# Patient Record
Sex: Female | Born: 1945
Health system: Southern US, Community
[De-identification: ages and names within clinical notes are randomized; demographics above are authoritative.]

---

## 1997-08-24 ENCOUNTER — Ambulatory Visit (HOSPITAL_COMMUNITY): Admission: RE | Admit: 1997-08-24 | Discharge: 1997-08-24 | Payer: Self-pay | Admitting: Family Medicine

## 1997-09-23 ENCOUNTER — Ambulatory Visit (HOSPITAL_COMMUNITY): Admission: RE | Admit: 1997-09-23 | Discharge: 1997-09-23 | Payer: Self-pay | Admitting: Gastroenterology

## 1997-10-05 ENCOUNTER — Ambulatory Visit (HOSPITAL_COMMUNITY): Admission: RE | Admit: 1997-10-05 | Discharge: 1997-10-05 | Payer: Self-pay | Admitting: Family Medicine

## 1997-10-05 ENCOUNTER — Encounter: Payer: Self-pay | Admitting: Family Medicine

## 2000-07-10 ENCOUNTER — Encounter: Payer: Self-pay | Admitting: Family Medicine

## 2000-07-10 ENCOUNTER — Ambulatory Visit (HOSPITAL_COMMUNITY): Admission: RE | Admit: 2000-07-10 | Discharge: 2000-07-10 | Payer: Self-pay | Admitting: Family Medicine

## 2000-11-19 ENCOUNTER — Other Ambulatory Visit: Admission: RE | Admit: 2000-11-19 | Discharge: 2000-11-19 | Payer: Self-pay | Admitting: Family Medicine

## 2001-07-02 ENCOUNTER — Ambulatory Visit (HOSPITAL_COMMUNITY): Admission: RE | Admit: 2001-07-02 | Discharge: 2001-07-02 | Payer: Self-pay | Admitting: Gastroenterology

## 2001-07-02 ENCOUNTER — Encounter (INDEPENDENT_AMBULATORY_CARE_PROVIDER_SITE_OTHER): Payer: Self-pay | Admitting: Specialist

## 2002-04-04 ENCOUNTER — Other Ambulatory Visit: Admission: RE | Admit: 2002-04-04 | Discharge: 2002-04-04 | Payer: Self-pay | Admitting: Family Medicine

## 2003-12-18 ENCOUNTER — Other Ambulatory Visit: Admission: RE | Admit: 2003-12-18 | Discharge: 2003-12-18 | Payer: Self-pay | Admitting: Family Medicine

## 2005-03-09 ENCOUNTER — Other Ambulatory Visit: Admission: RE | Admit: 2005-03-09 | Discharge: 2005-03-09 | Payer: Self-pay | Admitting: Family Medicine

## 2006-06-29 ENCOUNTER — Other Ambulatory Visit: Admission: RE | Admit: 2006-06-29 | Discharge: 2006-06-29 | Payer: Self-pay | Admitting: Family Medicine

## 2007-10-24 ENCOUNTER — Other Ambulatory Visit: Admission: RE | Admit: 2007-10-24 | Discharge: 2007-10-24 | Payer: Self-pay | Admitting: Family Medicine

## 2008-11-05 ENCOUNTER — Other Ambulatory Visit: Admission: RE | Admit: 2008-11-05 | Discharge: 2008-11-05 | Payer: Self-pay | Admitting: Family Medicine

## 2010-05-27 NOTE — Procedures (Signed)
[place]  Patient:    SHEMIAH, ROSCH Visit Number: [id number] MRN: [id number]          Service Type: END Location: ENDO Attending Physician:  Orland Mustard Dictated by:   Llana Aliment. Randa Evens, M.D. Proc. Date: 07/02/01 Admit Date:  07/02/2001   CC:         Stacie Acres. Cliffton Asters, M.D.   Procedure Report  DATE OF BIRTH:  1945-12-24.  PROCEDURE:  Esophagogastroduodenoscopy and biopsy.  MEDICATIONS:  Cetacaine spray, fentanyl 65 mcg, Versed 6.5 mg IV.  INDICATION:  Iron deficiency anemia.  DESCRIPTION OF PROCEDURE:  The procedure had been explained to the patient and consent obtained.  With the patient in the left lateral decubitus position, the Olympus video scope was inserted, advanced under direct visualization. The stomach was entered, the pylorus identified and passed.  We passed down to approximately 60 or 65 cm, which was as far as I could advance down into the small bowel.  This was endoscopically normal.  Several random biopsies were taken to rule out celiac disease.  The duodenal bulb was free of ulceration. The antrum and body of the stomach were normal with no ulceration or inflammation.  The fundus and cardia were seen well on the retroflex view and were normal.  The distal esophagus was somewhat reddened consistent with mild esophageal reflux disease.  The patient does take Nexium for this.  No ulcerations.  The proximal esophagus was normal.  The scope was withdrawn. The patient tolerated the procedure well, maintained on low-flow oxygen and pulse oximeter throughout the procedure.  ASSESSMENT: 1. Iron deficiency anemia, no gross evidence of source of gastrointestinal    blood loss on upper endoscopy. 2. Probable mild esophageal reflux disease.  PLAN:  Will check pathology for screening for celiac disease and proceed with colonoscopy at this time.  Will continue Nexium.Dictated by:   Llana Aliment. Randa Evens,  M.D. Attending Physician:  Orland Mustard DD:  07/02/01 TD:  07/03/01 Job: [id number]

## 2010-05-27 NOTE — Procedures (Signed)
[place]  Patient:    Gina Maxwell, Gina Maxwell Visit Number: [id number] MRN: [id number]          Service Type: END Location: ENDO Attending Physician:  Orland Mustard Dictated by:   Llana Aliment. Randa Evens, M.D. Proc. Date: 07/02/01 Admit Date:  07/02/2001   CC:         Stacie Acres. Cliffton Asters, M.D.   Procedure Report  DATE OF BIRTH:  23-Sep-1945  PROCEDURE PERFORMED:  Colonoscopy.  ENDOSCOPIST:  Llana Aliment. Randa Evens, M.D.  MEDICATIONS USED:  Fentanyl 100 mcg, Versed 10 mg for both procedures.  INSTRUMENT:  Olympus pediatric video colonoscope.  INDICATIONS:  Iron deficiency anemia in a woman with a strong family history of colon cancer with multiple family members all having colon cancer as well as polyps.  She is due for a routine five year colonoscopy next year but due to her recent iron deficiency anemia, this has been pushed up to now.  DESCRIPTION OF PROCEDURE:  The procedure had been explained to the patient and consent obtained.  With the patient in the left lateral decubitus position, the Olympus pediatric video colonoscope was inserted and advanced under direct visualization.  The prep was excellent and we were able to advance to the cecum without difficulty   The ileocecal valve and appendiceal orifice were seen well.  The scope was withdrawn.  The cecum, ascending colon, hepatic flexure, transverse colon, splenic flexure, descending and sigmoid colon were seen well.  No polyps or other lesions.  No significant diverticular disease. Scope withdrawn, patient tolerated the procedure well.  ASSESSMENT: 1. No evidence of polyps in this woman with a strong family history of colonic    neoplasia. 2. Iron deficiency anemia with no obvious cause on colonoscopy.  PLAN: 1. Will repeat procedure in five years due to her strong family history of    colon cancer, put her on our list to contact her then. 2. Recommend yearly stools for  hemoccults. 3. Will see back in the office in three months.  Will continue iron    supplements. Dictated by:   Llana Aliment. Randa Evens, M.D. Attending Physician:  Orland Mustard DD:  07/02/01 TD:  07/03/01 Job: [id number]

## 2011-01-12 ENCOUNTER — Other Ambulatory Visit: Payer: Self-pay | Admitting: Family Medicine

## 2011-01-12 ENCOUNTER — Other Ambulatory Visit (HOSPITAL_COMMUNITY)
Admission: RE | Admit: 2011-01-12 | Discharge: 2011-01-12 | Disposition: A | Discharge status: Home / Self Care | Payer: Medicare Other | Source: Ambulatory Visit | Attending: Family Medicine | Admitting: Family Medicine

## 2011-01-12 DIAGNOSIS — Z124 Encounter for screening for malignant neoplasm of cervix: Secondary | ICD-10-CM | POA: Insufficient documentation

## 2011-08-28 ENCOUNTER — Encounter: Payer: Self-pay | Admitting: *Deleted

## 2011-08-28 NOTE — Telephone Encounter (Signed)
Error

## 2011-09-01 ENCOUNTER — Ambulatory Visit (INDEPENDENT_AMBULATORY_CARE_PROVIDER_SITE_OTHER): Payer: Medicare Other | Admitting: Internal Medicine

## 2011-09-01 DIAGNOSIS — K219 Gastro-esophageal reflux disease without esophagitis: Secondary | ICD-10-CM | POA: Insufficient documentation

## 2011-09-01 DIAGNOSIS — Z7189 Other specified counseling: Secondary | ICD-10-CM

## 2011-09-01 DIAGNOSIS — Z23 Encounter for immunization: Secondary | ICD-10-CM

## 2011-09-01 DIAGNOSIS — M129 Arthropathy, unspecified: Secondary | ICD-10-CM

## 2011-09-01 DIAGNOSIS — Z789 Other specified health status: Secondary | ICD-10-CM

## 2011-09-01 DIAGNOSIS — M1990 Unspecified osteoarthritis, unspecified site: Secondary | ICD-10-CM | POA: Insufficient documentation

## 2011-09-01 MED ORDER — TYPHOID VACCINE PO CPDR: 1.0000 | DELAYED_RELEASE_CAPSULE | ORAL | Status: AC

## 2011-09-01 MED ORDER — AZITHROMYCIN 250 MG PO TABS: ORAL_TABLET | ORAL | Status: AC

## 2011-09-01 MED ORDER — TYPHOID VACCINE PO CPDR: 1.0000 | DELAYED_RELEASE_CAPSULE | ORAL | Status: DC

## 2011-09-01 MED ORDER — AZITHROMYCIN 250 MG PO TABS: ORAL_TABLET | ORAL | Status: DC

## 2011-09-01 NOTE — Progress Notes (Signed)
  Subjective:    Patient ID: Gina Maxwell, female    DOB: [birth year], 66 y.o.   MRN: [id number]  HPI 66yo F with GERD, OA who has done extensive international travel, who is about to go on a cruise to [employer] with her husband, sister, and brother in Social worker. Oct 4 th [place], [place] Oct 11, [place] Oct 12, [place] Oct 13, [place] Oct 14-18 at sea Oct 19 [place], [place] Oct 20 [place] Oct 21-22nd at sea Oct 23, [place] Then back to [place]  Prior travel in the last 10 yrs: [place], [place], [place], [place], [place], [place], [place], [place], [place], [place], [place], [place].  Generally she has been in good health,with travelling as well. Denies suffering from altitude sickness, motion sickness, insomnia, or jet lag.  She is uptodate in her vaccines including influenza, shingles vaccine: greater than 3yr ago, her brother in law had hepatitis, and they were instructed to get immunoglobulins. Never received hep a vaccine  Meds: aciphex, vitamin D, meloxicam  All: pcn  Review of Systems      Objective:   Physical Exam There were no vitals taken for this visit.      Assessment & Plan:  Pre-travel counseling:  Vaccination -> will give 1st dose of hep A today, with anticipatory guidelines; will give rx for vivotif, oral typhoid vaccine, with anticipatory guidelines  Traveler diarrhea prevention = counseled on do's and don'ts of eating local foods; gave rx for azithromycin if needed for TD.  mosquite bite prevention = recommended DEET, and long sleeves in evenings. No need for malaria prophylaxis.  Health maintenance = recommend to get flu vaccination before they leave on their trip to [place]  25 min spent with patient conducting pre-travel counseling

## 2012-02-13 ENCOUNTER — Ambulatory Visit
Admission: RE | Admit: 2012-02-13 | Discharge: 2012-02-13 | Disposition: A | Discharge status: Home / Self Care | Payer: Medicare Other | Source: Ambulatory Visit | Attending: Family Medicine | Admitting: Family Medicine

## 2012-02-13 ENCOUNTER — Other Ambulatory Visit: Payer: Self-pay | Admitting: Family Medicine

## 2012-02-13 DIAGNOSIS — M79672 Pain in left foot: Secondary | ICD-10-CM

## 2012-04-29 ENCOUNTER — Encounter: Payer: Self-pay | Admitting: *Deleted

## 2012-05-07 ENCOUNTER — Ambulatory Visit (INDEPENDENT_AMBULATORY_CARE_PROVIDER_SITE_OTHER): Payer: Medicare Other | Admitting: *Deleted

## 2012-05-07 DIAGNOSIS — Z23 Encounter for immunization: Secondary | ICD-10-CM

## 2014-04-22 ENCOUNTER — Other Ambulatory Visit: Payer: Self-pay | Admitting: Family Medicine

## 2014-04-22 ENCOUNTER — Other Ambulatory Visit (HOSPITAL_COMMUNITY)
Admission: RE | Admit: 2014-04-22 | Discharge: 2014-04-22 | Disposition: A | Discharge status: Home / Self Care | Payer: Medicare Other | Source: Ambulatory Visit | Attending: Family Medicine | Admitting: Family Medicine

## 2014-04-22 DIAGNOSIS — Z124 Encounter for screening for malignant neoplasm of cervix: Secondary | ICD-10-CM | POA: Insufficient documentation

## 2014-04-23 LAB — CYTOLOGY - PAP

## 2014-08-19 ENCOUNTER — Ambulatory Visit
Admission: RE | Admit: 2014-08-19 | Discharge: 2014-08-19 | Disposition: A | Discharge status: Home / Self Care | Payer: Medicare Other | Source: Ambulatory Visit | Attending: Family Medicine | Admitting: Family Medicine

## 2014-08-19 ENCOUNTER — Other Ambulatory Visit: Payer: Self-pay | Admitting: Family Medicine

## 2014-08-19 DIAGNOSIS — M25561 Pain in right knee: Secondary | ICD-10-CM

## 2015-01-15 DIAGNOSIS — M25561 Pain in right knee: Secondary | ICD-10-CM | POA: Diagnosis not present

## 2015-01-26 DIAGNOSIS — M25561 Pain in right knee: Secondary | ICD-10-CM | POA: Diagnosis not present

## 2015-01-26 DIAGNOSIS — S83206A Unspecified tear of unspecified meniscus, current injury, right knee, initial encounter: Secondary | ICD-10-CM | POA: Diagnosis not present

## 2015-03-08 DIAGNOSIS — M25561 Pain in right knee: Secondary | ICD-10-CM | POA: Diagnosis not present

## 2015-04-20 DIAGNOSIS — M25561 Pain in right knee: Secondary | ICD-10-CM | POA: Diagnosis not present

## 2015-04-30 DIAGNOSIS — M1711 Unilateral primary osteoarthritis, right knee: Secondary | ICD-10-CM | POA: Diagnosis not present

## 2015-04-30 DIAGNOSIS — M25561 Pain in right knee: Secondary | ICD-10-CM | POA: Diagnosis not present

## 2015-05-20 DIAGNOSIS — M25561 Pain in right knee: Secondary | ICD-10-CM | POA: Diagnosis not present

## 2015-05-20 DIAGNOSIS — M1711 Unilateral primary osteoarthritis, right knee: Secondary | ICD-10-CM | POA: Diagnosis not present

## 2015-06-04 DIAGNOSIS — Y999 Unspecified external cause status: Secondary | ICD-10-CM | POA: Diagnosis not present

## 2015-06-04 DIAGNOSIS — Y929 Unspecified place or not applicable: Secondary | ICD-10-CM | POA: Diagnosis not present

## 2015-06-04 DIAGNOSIS — G8918 Other acute postprocedural pain: Secondary | ICD-10-CM | POA: Diagnosis not present

## 2015-06-04 DIAGNOSIS — S83241A Other tear of medial meniscus, current injury, right knee, initial encounter: Secondary | ICD-10-CM | POA: Diagnosis not present

## 2015-06-04 DIAGNOSIS — M23321 Other meniscus derangements, posterior horn of medial meniscus, right knee: Secondary | ICD-10-CM | POA: Diagnosis not present

## 2015-06-04 DIAGNOSIS — Y939 Activity, unspecified: Secondary | ICD-10-CM | POA: Diagnosis not present

## 2015-06-04 DIAGNOSIS — X58XXXA Exposure to other specified factors, initial encounter: Secondary | ICD-10-CM | POA: Diagnosis not present

## 2015-06-04 DIAGNOSIS — M2241 Chondromalacia patellae, right knee: Secondary | ICD-10-CM | POA: Diagnosis not present

## 2015-06-04 DIAGNOSIS — M94261 Chondromalacia, right knee: Secondary | ICD-10-CM | POA: Diagnosis not present

## 2015-06-10 DIAGNOSIS — M1711 Unilateral primary osteoarthritis, right knee: Secondary | ICD-10-CM | POA: Diagnosis not present

## 2015-06-15 DIAGNOSIS — R262 Difficulty in walking, not elsewhere classified: Secondary | ICD-10-CM | POA: Diagnosis not present

## 2015-06-15 DIAGNOSIS — M25561 Pain in right knee: Secondary | ICD-10-CM | POA: Diagnosis not present

## 2015-06-15 DIAGNOSIS — M25661 Stiffness of right knee, not elsewhere classified: Secondary | ICD-10-CM | POA: Diagnosis not present

## 2015-06-16 DIAGNOSIS — M25561 Pain in right knee: Secondary | ICD-10-CM | POA: Diagnosis not present

## 2015-06-16 DIAGNOSIS — R262 Difficulty in walking, not elsewhere classified: Secondary | ICD-10-CM | POA: Diagnosis not present

## 2015-06-16 DIAGNOSIS — M25661 Stiffness of right knee, not elsewhere classified: Secondary | ICD-10-CM | POA: Diagnosis not present

## 2015-06-22 DIAGNOSIS — M25661 Stiffness of right knee, not elsewhere classified: Secondary | ICD-10-CM | POA: Diagnosis not present

## 2015-06-22 DIAGNOSIS — R262 Difficulty in walking, not elsewhere classified: Secondary | ICD-10-CM | POA: Diagnosis not present

## 2015-06-22 DIAGNOSIS — M25561 Pain in right knee: Secondary | ICD-10-CM | POA: Diagnosis not present

## 2015-06-24 DIAGNOSIS — M25561 Pain in right knee: Secondary | ICD-10-CM | POA: Diagnosis not present

## 2015-06-24 DIAGNOSIS — M25661 Stiffness of right knee, not elsewhere classified: Secondary | ICD-10-CM | POA: Diagnosis not present

## 2015-06-24 DIAGNOSIS — R262 Difficulty in walking, not elsewhere classified: Secondary | ICD-10-CM | POA: Diagnosis not present

## 2015-06-28 DIAGNOSIS — M25561 Pain in right knee: Secondary | ICD-10-CM | POA: Diagnosis not present

## 2015-06-28 DIAGNOSIS — R262 Difficulty in walking, not elsewhere classified: Secondary | ICD-10-CM | POA: Diagnosis not present

## 2015-06-28 DIAGNOSIS — M25661 Stiffness of right knee, not elsewhere classified: Secondary | ICD-10-CM | POA: Diagnosis not present

## 2015-06-29 DIAGNOSIS — Z9889 Other specified postprocedural states: Secondary | ICD-10-CM | POA: Diagnosis not present

## 2015-07-02 DIAGNOSIS — M25661 Stiffness of right knee, not elsewhere classified: Secondary | ICD-10-CM | POA: Diagnosis not present

## 2015-07-02 DIAGNOSIS — R262 Difficulty in walking, not elsewhere classified: Secondary | ICD-10-CM | POA: Diagnosis not present

## 2015-07-02 DIAGNOSIS — M25561 Pain in right knee: Secondary | ICD-10-CM | POA: Diagnosis not present

## 2015-07-06 DIAGNOSIS — M25661 Stiffness of right knee, not elsewhere classified: Secondary | ICD-10-CM | POA: Diagnosis not present

## 2015-07-06 DIAGNOSIS — M25561 Pain in right knee: Secondary | ICD-10-CM | POA: Diagnosis not present

## 2015-07-06 DIAGNOSIS — R262 Difficulty in walking, not elsewhere classified: Secondary | ICD-10-CM | POA: Diagnosis not present

## 2015-07-22 DIAGNOSIS — M25561 Pain in right knee: Secondary | ICD-10-CM | POA: Diagnosis not present

## 2015-08-19 DIAGNOSIS — Z9889 Other specified postprocedural states: Secondary | ICD-10-CM | POA: Diagnosis not present

## 2015-09-23 DIAGNOSIS — M1711 Unilateral primary osteoarthritis, right knee: Secondary | ICD-10-CM | POA: Diagnosis not present

## 2015-09-30 DIAGNOSIS — M1711 Unilateral primary osteoarthritis, right knee: Secondary | ICD-10-CM | POA: Diagnosis not present

## 2015-12-08 DIAGNOSIS — M159 Polyosteoarthritis, unspecified: Secondary | ICD-10-CM | POA: Diagnosis not present

## 2015-12-08 DIAGNOSIS — M154 Erosive (osteo)arthritis: Secondary | ICD-10-CM | POA: Diagnosis not present

## 2015-12-08 DIAGNOSIS — M79643 Pain in unspecified hand: Secondary | ICD-10-CM | POA: Diagnosis not present

## 2015-12-08 DIAGNOSIS — Z791 Long term (current) use of non-steroidal anti-inflammatories (NSAID): Secondary | ICD-10-CM | POA: Diagnosis not present

## 2015-12-10 DIAGNOSIS — Z1231 Encounter for screening mammogram for malignant neoplasm of breast: Secondary | ICD-10-CM | POA: Diagnosis not present

## 2015-12-13 DIAGNOSIS — M1711 Unilateral primary osteoarthritis, right knee: Secondary | ICD-10-CM | POA: Diagnosis not present

## 2015-12-14 DIAGNOSIS — R928 Other abnormal and inconclusive findings on diagnostic imaging of breast: Secondary | ICD-10-CM | POA: Diagnosis not present

## 2015-12-14 DIAGNOSIS — R922 Inconclusive mammogram: Secondary | ICD-10-CM | POA: Diagnosis not present

## 2015-12-14 DIAGNOSIS — R921 Mammographic calcification found on diagnostic imaging of breast: Secondary | ICD-10-CM | POA: Diagnosis not present

## 2015-12-20 DIAGNOSIS — M1711 Unilateral primary osteoarthritis, right knee: Secondary | ICD-10-CM | POA: Diagnosis not present

## 2015-12-27 DIAGNOSIS — M1711 Unilateral primary osteoarthritis, right knee: Secondary | ICD-10-CM | POA: Diagnosis not present

## 2015-12-29 IMAGING — CR DG KNEE COMPLETE 4+V*R*
1 series · 1 of 1 positions shown · non-contrast
Comparison: None.

CLINICAL DATA: Right knee pain.

EXAM:
RIGHT KNEE - COMPLETE 4+ VIEW

[view not recorded]
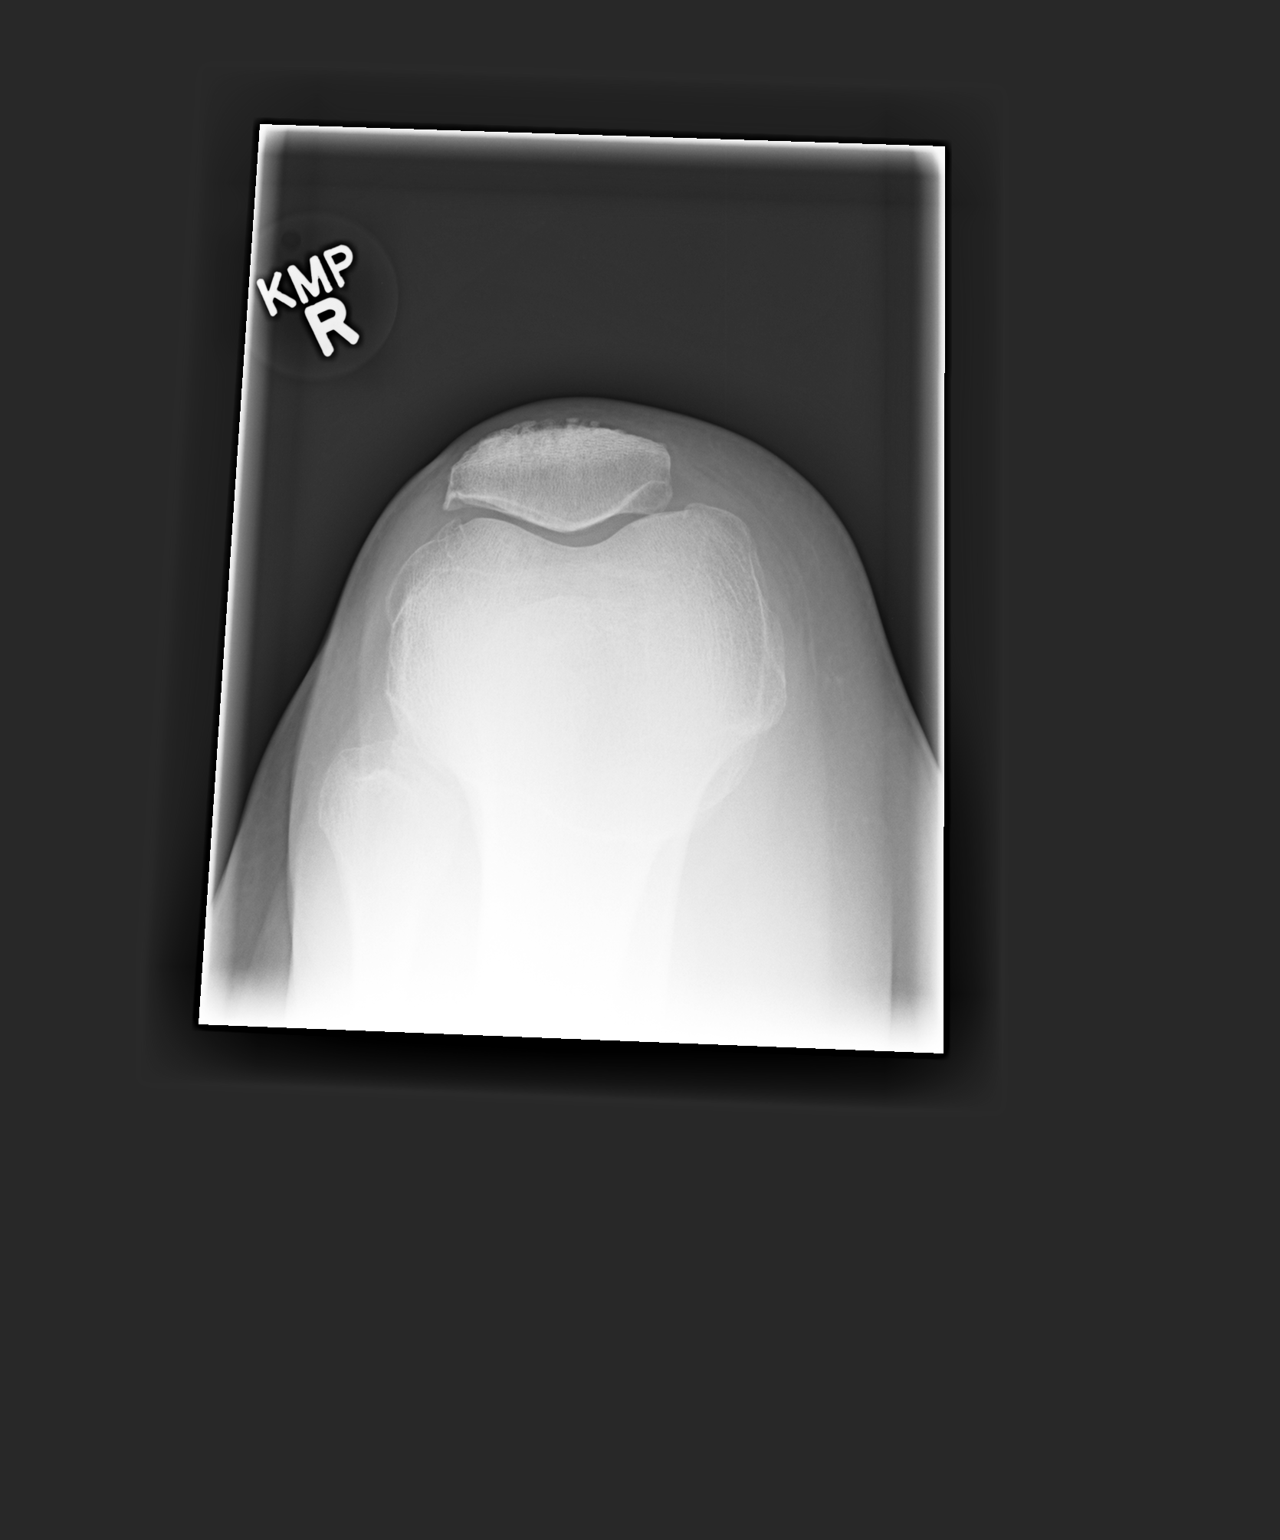

[1 of 1 positions shown; findings below may reference images not displayed]

FINDINGS: No acute bony or joint abnormality identified. No evidence of
fracture or dislocation. Mild degenerative change noted about the
medial patellofemoral compartments. Small knee joint effusion cannot
be excluded.
IMPRESSION: 1. Mild degenerative change.
2. Cannot exclude small knee joint effusion. No acute bony
abnormality.

## 2016-02-03 DIAGNOSIS — M1711 Unilateral primary osteoarthritis, right knee: Secondary | ICD-10-CM | POA: Diagnosis not present

## 2016-03-28 DIAGNOSIS — K219 Gastro-esophageal reflux disease without esophagitis: Secondary | ICD-10-CM | POA: Diagnosis not present

## 2016-03-28 DIAGNOSIS — N8111 Cystocele, midline: Secondary | ICD-10-CM | POA: Diagnosis not present

## 2016-03-28 DIAGNOSIS — Z79899 Other long term (current) drug therapy: Secondary | ICD-10-CM | POA: Diagnosis not present

## 2016-03-28 DIAGNOSIS — M154 Erosive (osteo)arthritis: Secondary | ICD-10-CM | POA: Diagnosis not present

## 2016-03-28 DIAGNOSIS — Z0000 Encounter for general adult medical examination without abnormal findings: Secondary | ICD-10-CM | POA: Diagnosis not present

## 2016-03-28 DIAGNOSIS — E785 Hyperlipidemia, unspecified: Secondary | ICD-10-CM | POA: Diagnosis not present

## 2016-03-28 DIAGNOSIS — M8580 Other specified disorders of bone density and structure, unspecified site: Secondary | ICD-10-CM | POA: Diagnosis not present

## 2016-03-28 DIAGNOSIS — E559 Vitamin D deficiency, unspecified: Secondary | ICD-10-CM | POA: Diagnosis not present

## 2016-04-13 DIAGNOSIS — H2513 Age-related nuclear cataract, bilateral: Secondary | ICD-10-CM | POA: Diagnosis not present

## 2016-04-13 DIAGNOSIS — H524 Presbyopia: Secondary | ICD-10-CM | POA: Diagnosis not present

## 2016-04-13 DIAGNOSIS — H25013 Cortical age-related cataract, bilateral: Secondary | ICD-10-CM | POA: Diagnosis not present

## 2016-04-27 DIAGNOSIS — M8589 Other specified disorders of bone density and structure, multiple sites: Secondary | ICD-10-CM | POA: Diagnosis not present

## 2016-12-06 DIAGNOSIS — Z791 Long term (current) use of non-steroidal anti-inflammatories (NSAID): Secondary | ICD-10-CM | POA: Diagnosis not present

## 2016-12-06 DIAGNOSIS — M159 Polyosteoarthritis, unspecified: Secondary | ICD-10-CM | POA: Diagnosis not present

## 2016-12-06 DIAGNOSIS — M2550 Pain in unspecified joint: Secondary | ICD-10-CM | POA: Diagnosis not present

## 2016-12-06 DIAGNOSIS — M154 Erosive (osteo)arthritis: Secondary | ICD-10-CM | POA: Diagnosis not present

## 2016-12-06 DIAGNOSIS — M79643 Pain in unspecified hand: Secondary | ICD-10-CM | POA: Diagnosis not present

## 2016-12-11 DIAGNOSIS — Z1231 Encounter for screening mammogram for malignant neoplasm of breast: Secondary | ICD-10-CM | POA: Diagnosis not present

## 2017-03-31 DIAGNOSIS — J309 Allergic rhinitis, unspecified: Secondary | ICD-10-CM | POA: Diagnosis not present

## 2017-04-03 DIAGNOSIS — Z136 Encounter for screening for cardiovascular disorders: Secondary | ICD-10-CM | POA: Diagnosis not present

## 2017-04-03 DIAGNOSIS — M8588 Other specified disorders of bone density and structure, other site: Secondary | ICD-10-CM | POA: Diagnosis not present

## 2017-04-03 DIAGNOSIS — E785 Hyperlipidemia, unspecified: Secondary | ICD-10-CM | POA: Diagnosis not present

## 2017-04-03 DIAGNOSIS — E559 Vitamin D deficiency, unspecified: Secondary | ICD-10-CM | POA: Diagnosis not present

## 2017-04-03 DIAGNOSIS — Z0000 Encounter for general adult medical examination without abnormal findings: Secondary | ICD-10-CM | POA: Diagnosis not present

## 2017-04-03 DIAGNOSIS — K219 Gastro-esophageal reflux disease without esophagitis: Secondary | ICD-10-CM | POA: Diagnosis not present

## 2017-04-03 DIAGNOSIS — Z79899 Other long term (current) drug therapy: Secondary | ICD-10-CM | POA: Diagnosis not present

## 2017-04-03 DIAGNOSIS — Z23 Encounter for immunization: Secondary | ICD-10-CM | POA: Diagnosis not present

## 2017-04-03 DIAGNOSIS — Z800 Family history of malignant neoplasm of digestive organs: Secondary | ICD-10-CM | POA: Diagnosis not present

## 2017-05-07 DIAGNOSIS — H25013 Cortical age-related cataract, bilateral: Secondary | ICD-10-CM | POA: Diagnosis not present

## 2017-05-07 DIAGNOSIS — H35373 Puckering of macula, bilateral: Secondary | ICD-10-CM | POA: Diagnosis not present

## 2017-06-01 DIAGNOSIS — M79643 Pain in unspecified hand: Secondary | ICD-10-CM | POA: Diagnosis not present

## 2017-06-01 DIAGNOSIS — Z791 Long term (current) use of non-steroidal anti-inflammatories (NSAID): Secondary | ICD-10-CM | POA: Diagnosis not present

## 2017-06-01 DIAGNOSIS — M154 Erosive (osteo)arthritis: Secondary | ICD-10-CM | POA: Diagnosis not present

## 2017-06-01 DIAGNOSIS — M2550 Pain in unspecified joint: Secondary | ICD-10-CM | POA: Diagnosis not present

## 2017-06-01 DIAGNOSIS — M159 Polyosteoarthritis, unspecified: Secondary | ICD-10-CM | POA: Diagnosis not present

## 2017-06-21 DIAGNOSIS — H2511 Age-related nuclear cataract, right eye: Secondary | ICD-10-CM | POA: Diagnosis not present

## 2017-06-21 DIAGNOSIS — H25811 Combined forms of age-related cataract, right eye: Secondary | ICD-10-CM | POA: Diagnosis not present

## 2017-06-28 DIAGNOSIS — H2512 Age-related nuclear cataract, left eye: Secondary | ICD-10-CM | POA: Diagnosis not present

## 2017-06-28 DIAGNOSIS — H25812 Combined forms of age-related cataract, left eye: Secondary | ICD-10-CM | POA: Diagnosis not present

## 2017-07-06 DIAGNOSIS — E785 Hyperlipidemia, unspecified: Secondary | ICD-10-CM | POA: Diagnosis not present

## 2017-08-01 ENCOUNTER — Ambulatory Visit
Admission: RE | Admit: 2017-08-01 | Discharge: 2017-08-01 | Disposition: A | Discharge status: Home / Self Care | Payer: Medicare Other | Source: Ambulatory Visit | Attending: Family Medicine | Admitting: Family Medicine

## 2017-08-01 ENCOUNTER — Other Ambulatory Visit: Payer: Self-pay | Admitting: Family Medicine

## 2017-08-01 DIAGNOSIS — M25572 Pain in left ankle and joints of left foot: Secondary | ICD-10-CM

## 2017-08-01 DIAGNOSIS — S8262XA Displaced fracture of lateral malleolus of left fibula, initial encounter for closed fracture: Secondary | ICD-10-CM | POA: Diagnosis not present

## 2017-08-03 DIAGNOSIS — M25572 Pain in left ankle and joints of left foot: Secondary | ICD-10-CM | POA: Diagnosis not present

## 2017-08-17 DIAGNOSIS — M25572 Pain in left ankle and joints of left foot: Secondary | ICD-10-CM | POA: Diagnosis not present

## 2017-09-17 DIAGNOSIS — M25572 Pain in left ankle and joints of left foot: Secondary | ICD-10-CM | POA: Diagnosis not present

## 2017-10-23 DIAGNOSIS — H35373 Puckering of macula, bilateral: Secondary | ICD-10-CM | POA: Diagnosis not present

## 2017-10-23 DIAGNOSIS — H40013 Open angle with borderline findings, low risk, bilateral: Secondary | ICD-10-CM | POA: Diagnosis not present

## 2017-10-23 DIAGNOSIS — H40053 Ocular hypertension, bilateral: Secondary | ICD-10-CM | POA: Diagnosis not present

## 2017-11-20 DIAGNOSIS — J069 Acute upper respiratory infection, unspecified: Secondary | ICD-10-CM | POA: Diagnosis not present

## 2017-11-20 DIAGNOSIS — H9319 Tinnitus, unspecified ear: Secondary | ICD-10-CM | POA: Diagnosis not present

## 2017-12-03 DIAGNOSIS — M2550 Pain in unspecified joint: Secondary | ICD-10-CM | POA: Diagnosis not present

## 2017-12-03 DIAGNOSIS — M79643 Pain in unspecified hand: Secondary | ICD-10-CM | POA: Diagnosis not present

## 2017-12-03 DIAGNOSIS — Z791 Long term (current) use of non-steroidal anti-inflammatories (NSAID): Secondary | ICD-10-CM | POA: Diagnosis not present

## 2017-12-03 DIAGNOSIS — M8580 Other specified disorders of bone density and structure, unspecified site: Secondary | ICD-10-CM | POA: Diagnosis not present

## 2017-12-03 DIAGNOSIS — M154 Erosive (osteo)arthritis: Secondary | ICD-10-CM | POA: Diagnosis not present

## 2017-12-03 DIAGNOSIS — M159 Polyosteoarthritis, unspecified: Secondary | ICD-10-CM | POA: Diagnosis not present

## 2017-12-12 DIAGNOSIS — Z1231 Encounter for screening mammogram for malignant neoplasm of breast: Secondary | ICD-10-CM | POA: Diagnosis not present

## 2018-04-05 DIAGNOSIS — E559 Vitamin D deficiency, unspecified: Secondary | ICD-10-CM | POA: Diagnosis not present

## 2018-04-05 DIAGNOSIS — Z800 Family history of malignant neoplasm of digestive organs: Secondary | ICD-10-CM | POA: Diagnosis not present

## 2018-04-05 DIAGNOSIS — E785 Hyperlipidemia, unspecified: Secondary | ICD-10-CM | POA: Diagnosis not present

## 2018-04-05 DIAGNOSIS — F4321 Adjustment disorder with depressed mood: Secondary | ICD-10-CM | POA: Diagnosis not present

## 2018-04-05 DIAGNOSIS — M8588 Other specified disorders of bone density and structure, other site: Secondary | ICD-10-CM | POA: Diagnosis not present

## 2018-04-05 DIAGNOSIS — M154 Erosive (osteo)arthritis: Secondary | ICD-10-CM | POA: Diagnosis not present

## 2018-04-05 DIAGNOSIS — R252 Cramp and spasm: Secondary | ICD-10-CM | POA: Diagnosis not present

## 2018-04-05 DIAGNOSIS — Z0000 Encounter for general adult medical examination without abnormal findings: Secondary | ICD-10-CM | POA: Diagnosis not present

## 2018-04-05 DIAGNOSIS — Z79899 Other long term (current) drug therapy: Secondary | ICD-10-CM | POA: Diagnosis not present

## 2018-04-05 DIAGNOSIS — K219 Gastro-esophageal reflux disease without esophagitis: Secondary | ICD-10-CM | POA: Diagnosis not present

## 2018-05-06 DIAGNOSIS — E559 Vitamin D deficiency, unspecified: Secondary | ICD-10-CM | POA: Diagnosis not present

## 2018-05-06 DIAGNOSIS — E785 Hyperlipidemia, unspecified: Secondary | ICD-10-CM | POA: Diagnosis not present

## 2018-05-06 DIAGNOSIS — Z79899 Other long term (current) drug therapy: Secondary | ICD-10-CM | POA: Diagnosis not present

## 2018-05-06 DIAGNOSIS — K219 Gastro-esophageal reflux disease without esophagitis: Secondary | ICD-10-CM | POA: Diagnosis not present

## 2018-05-14 DIAGNOSIS — D649 Anemia, unspecified: Secondary | ICD-10-CM | POA: Diagnosis not present

## 2018-05-23 DIAGNOSIS — D509 Iron deficiency anemia, unspecified: Secondary | ICD-10-CM | POA: Diagnosis not present

## 2018-05-23 DIAGNOSIS — K2930 Chronic superficial gastritis without bleeding: Secondary | ICD-10-CM | POA: Diagnosis not present

## 2018-05-28 DIAGNOSIS — K2930 Chronic superficial gastritis without bleeding: Secondary | ICD-10-CM | POA: Diagnosis not present

## 2018-06-04 DIAGNOSIS — M159 Polyosteoarthritis, unspecified: Secondary | ICD-10-CM | POA: Diagnosis not present

## 2018-06-04 DIAGNOSIS — K2970 Gastritis, unspecified, without bleeding: Secondary | ICD-10-CM | POA: Diagnosis not present

## 2018-06-04 DIAGNOSIS — M8580 Other specified disorders of bone density and structure, unspecified site: Secondary | ICD-10-CM | POA: Diagnosis not present

## 2018-06-04 DIAGNOSIS — Z791 Long term (current) use of non-steroidal anti-inflammatories (NSAID): Secondary | ICD-10-CM | POA: Diagnosis not present

## 2018-06-04 DIAGNOSIS — M154 Erosive (osteo)arthritis: Secondary | ICD-10-CM | POA: Diagnosis not present

## 2018-06-04 DIAGNOSIS — M2550 Pain in unspecified joint: Secondary | ICD-10-CM | POA: Diagnosis not present

## 2018-06-04 DIAGNOSIS — M79643 Pain in unspecified hand: Secondary | ICD-10-CM | POA: Diagnosis not present

## 2018-06-04 DIAGNOSIS — D509 Iron deficiency anemia, unspecified: Secondary | ICD-10-CM | POA: Diagnosis not present

## 2018-07-01 DIAGNOSIS — D509 Iron deficiency anemia, unspecified: Secondary | ICD-10-CM | POA: Diagnosis not present

## 2018-07-17 ENCOUNTER — Ambulatory Visit (HOSPITAL_COMMUNITY)
Admission: RE | Admit: 2018-07-17 | Discharge: 2018-07-17 | Disposition: A | Discharge status: Home / Self Care | Payer: PPO | Source: Ambulatory Visit | Attending: Family Medicine | Admitting: Family Medicine

## 2018-07-17 ENCOUNTER — Other Ambulatory Visit: Payer: Self-pay

## 2018-07-17 DIAGNOSIS — D509 Iron deficiency anemia, unspecified: Secondary | ICD-10-CM | POA: Diagnosis not present

## 2018-07-17 MED ORDER — FERUMOXYTOL INJECTION 510 MG/17 ML
510.0000 mg | Freq: Once | INTRAVENOUS | Status: AC
  Administered 2018-07-17: 510 mg via INTRAVENOUS
  Filled 2018-07-17: qty 17

## 2018-07-17 MED ORDER — SODIUM CHLORIDE 0.9 % IV SOLN
INTRAVENOUS | Status: DC | PRN
  Administered 2018-07-17: 250 mL via INTRAVENOUS

## 2018-07-17 NOTE — Progress Notes (Signed)
Patient received Feraheme via PIV. Observed for at least 30 minutes post infusion.Tolerated well, vitals stable, discharge instructions given, verbalized understanding. Patient alert, oriented and ambulatory at the time of discharge.  

## 2018-07-17 NOTE — Discharge Instructions (Signed)

## 2018-07-24 ENCOUNTER — Ambulatory Visit (HOSPITAL_COMMUNITY)
Admission: RE | Admit: 2018-07-24 | Discharge: 2018-07-24 | Disposition: A | Discharge status: Home / Self Care | Payer: PPO | Source: Ambulatory Visit | Attending: Family Medicine | Admitting: Family Medicine

## 2018-07-24 ENCOUNTER — Other Ambulatory Visit: Payer: Self-pay

## 2018-07-24 DIAGNOSIS — D509 Iron deficiency anemia, unspecified: Secondary | ICD-10-CM | POA: Diagnosis not present

## 2018-07-24 MED ORDER — SODIUM CHLORIDE 0.9 % IV SOLN
510.0000 mg | Freq: Once | INTRAVENOUS | Status: AC
  Administered 2018-07-24: 510 mg via INTRAVENOUS
  Filled 2018-07-24: qty 17

## 2018-07-24 MED ORDER — SODIUM CHLORIDE 0.9 % IV SOLN
INTRAVENOUS | Status: DC | PRN
  Administered 2018-07-24: 250 mL via INTRAVENOUS

## 2018-07-24 NOTE — Discharge Instructions (Signed)

## 2018-07-24 NOTE — Progress Notes (Signed)
PATIENT CARE CENTER NOTE  Diagnosis: Iron Deficiency Anemia   Provider: Harlan Stains, MD   Procedure: IV Feraheme   Note: Patient received Feraheme infusion. Tolerated well. Observed patient for 30 minutes post-infusion. Vital signs stable. Discharge instructions given. Patient alert, oriented and ambulatory at discharge.

## 2018-09-06 DIAGNOSIS — Z23 Encounter for immunization: Secondary | ICD-10-CM | POA: Diagnosis not present

## 2018-09-06 DIAGNOSIS — D509 Iron deficiency anemia, unspecified: Secondary | ICD-10-CM | POA: Diagnosis not present

## 2018-11-05 DIAGNOSIS — H26493 Other secondary cataract, bilateral: Secondary | ICD-10-CM | POA: Diagnosis not present

## 2018-11-05 DIAGNOSIS — H524 Presbyopia: Secondary | ICD-10-CM | POA: Diagnosis not present

## 2018-11-05 DIAGNOSIS — H35373 Puckering of macula, bilateral: Secondary | ICD-10-CM | POA: Diagnosis not present

## 2018-11-05 DIAGNOSIS — Z961 Presence of intraocular lens: Secondary | ICD-10-CM | POA: Diagnosis not present

## 2018-12-02 DIAGNOSIS — D509 Iron deficiency anemia, unspecified: Secondary | ICD-10-CM | POA: Diagnosis not present

## 2018-12-09 DIAGNOSIS — M8580 Other specified disorders of bone density and structure, unspecified site: Secondary | ICD-10-CM | POA: Diagnosis not present

## 2018-12-09 DIAGNOSIS — Z791 Long term (current) use of non-steroidal anti-inflammatories (NSAID): Secondary | ICD-10-CM | POA: Diagnosis not present

## 2018-12-09 DIAGNOSIS — D509 Iron deficiency anemia, unspecified: Secondary | ICD-10-CM | POA: Diagnosis not present

## 2018-12-09 DIAGNOSIS — M79643 Pain in unspecified hand: Secondary | ICD-10-CM | POA: Diagnosis not present

## 2018-12-09 DIAGNOSIS — K2970 Gastritis, unspecified, without bleeding: Secondary | ICD-10-CM | POA: Diagnosis not present

## 2018-12-09 DIAGNOSIS — M154 Erosive (osteo)arthritis: Secondary | ICD-10-CM | POA: Diagnosis not present

## 2018-12-09 DIAGNOSIS — M159 Polyosteoarthritis, unspecified: Secondary | ICD-10-CM | POA: Diagnosis not present

## 2018-12-09 DIAGNOSIS — M2550 Pain in unspecified joint: Secondary | ICD-10-CM | POA: Diagnosis not present

## 2018-12-11 IMAGING — DX DG ANKLE COMPLETE 3+V*L*
3 series · 3 of 3 positions shown · non-contrast
Comparison: 02/13/2012

CLINICAL DATA: Twisting ankle injury, lateral swelling and bruising

EXAM:
LEFT ANKLE COMPLETE - 3+ VIEW

[dg ankle complete left (1 of 3)]
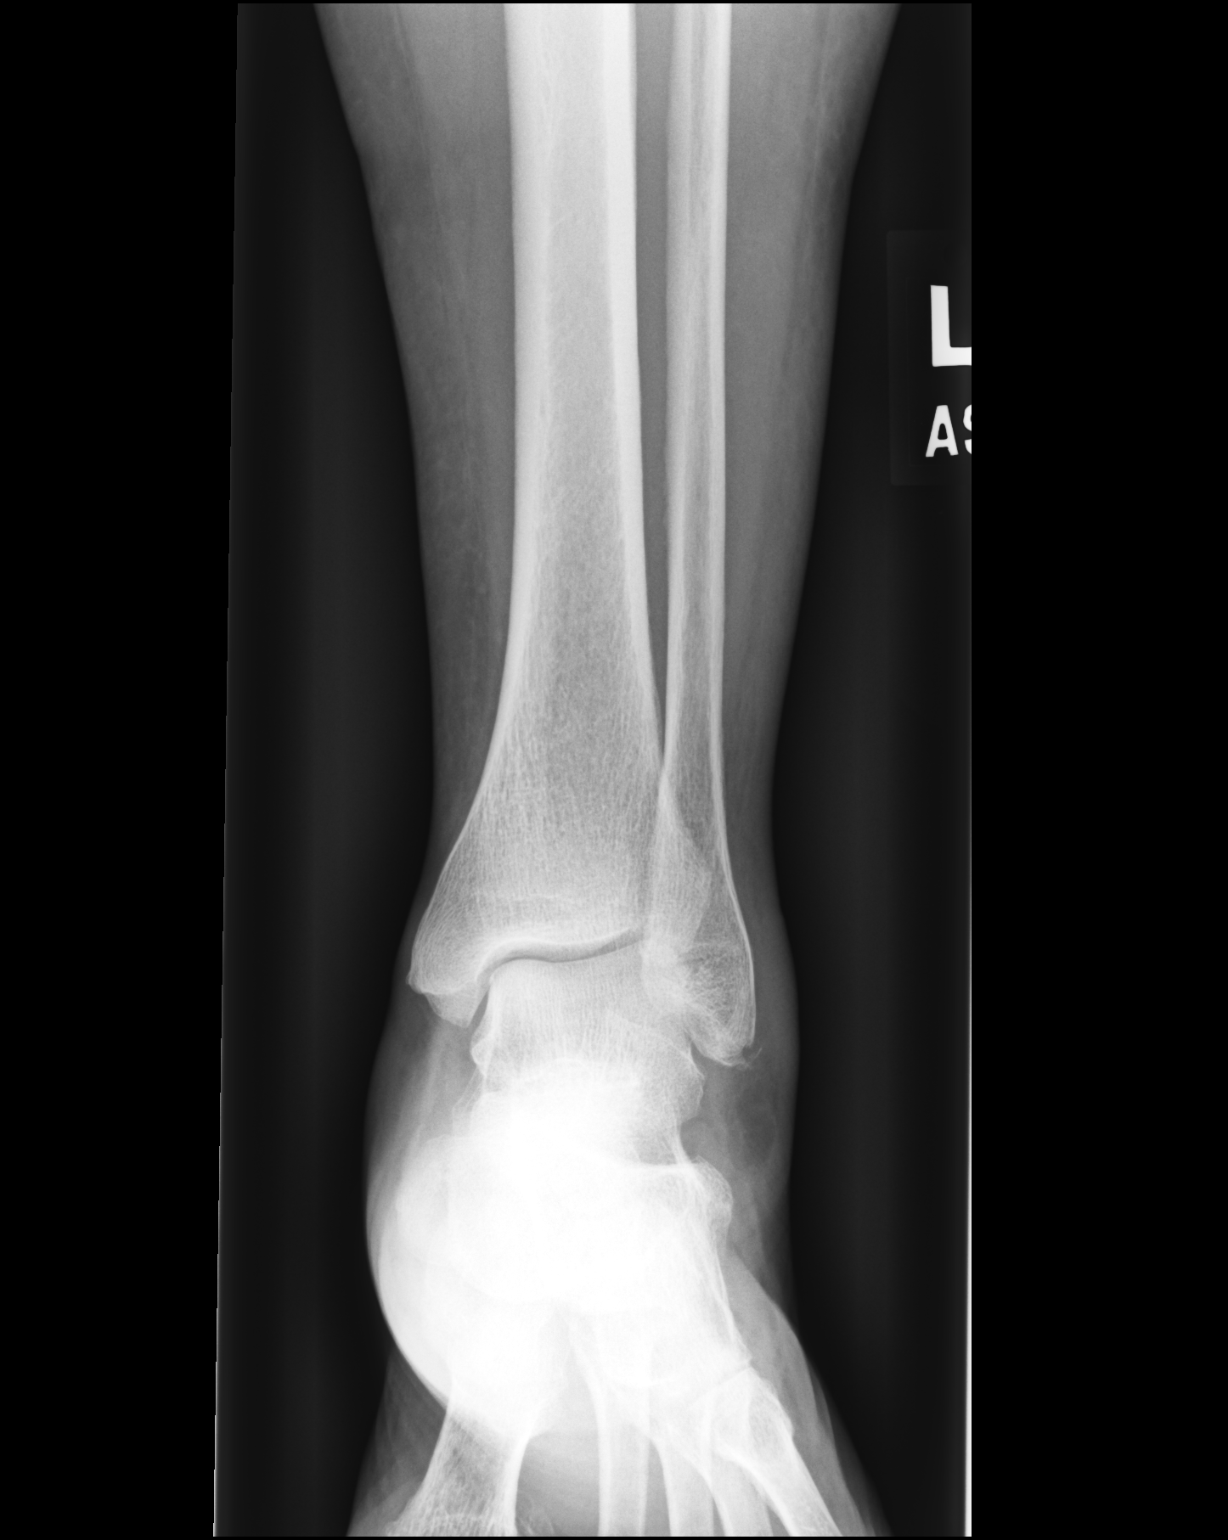

[dg ankle complete left (2 of 3)]
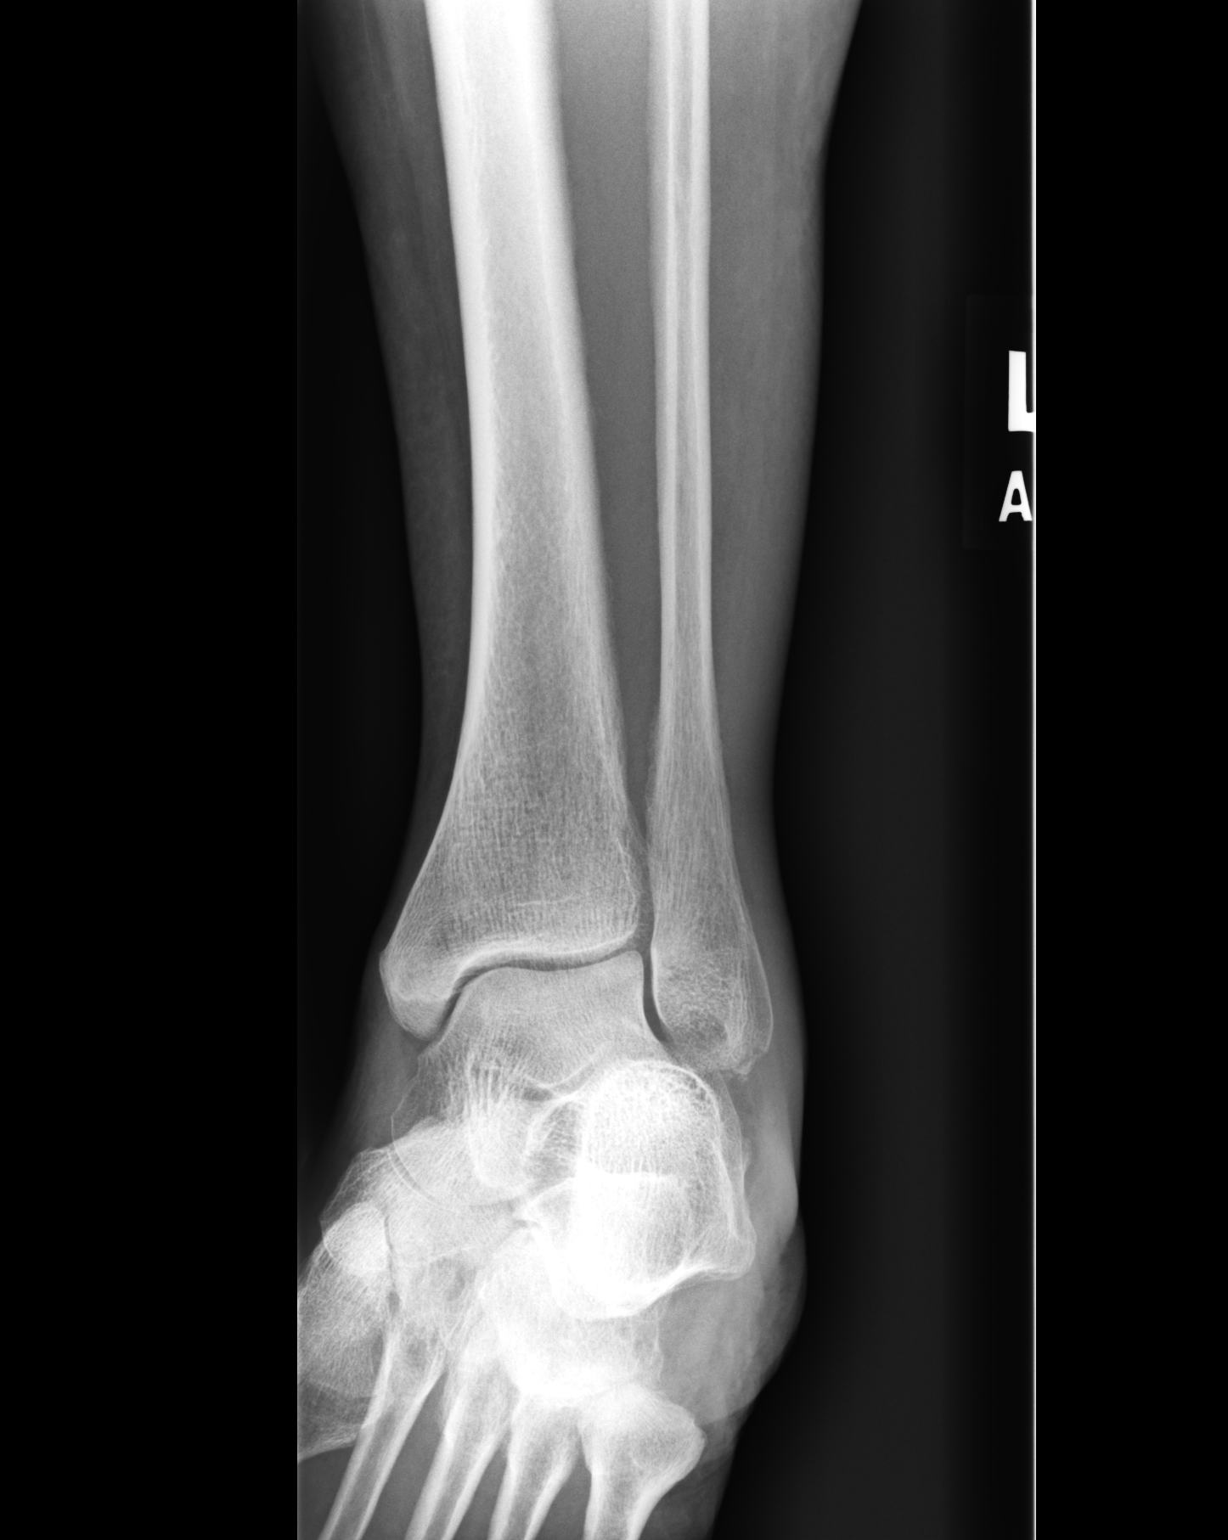

[dg ankle complete left (3 of 3)]
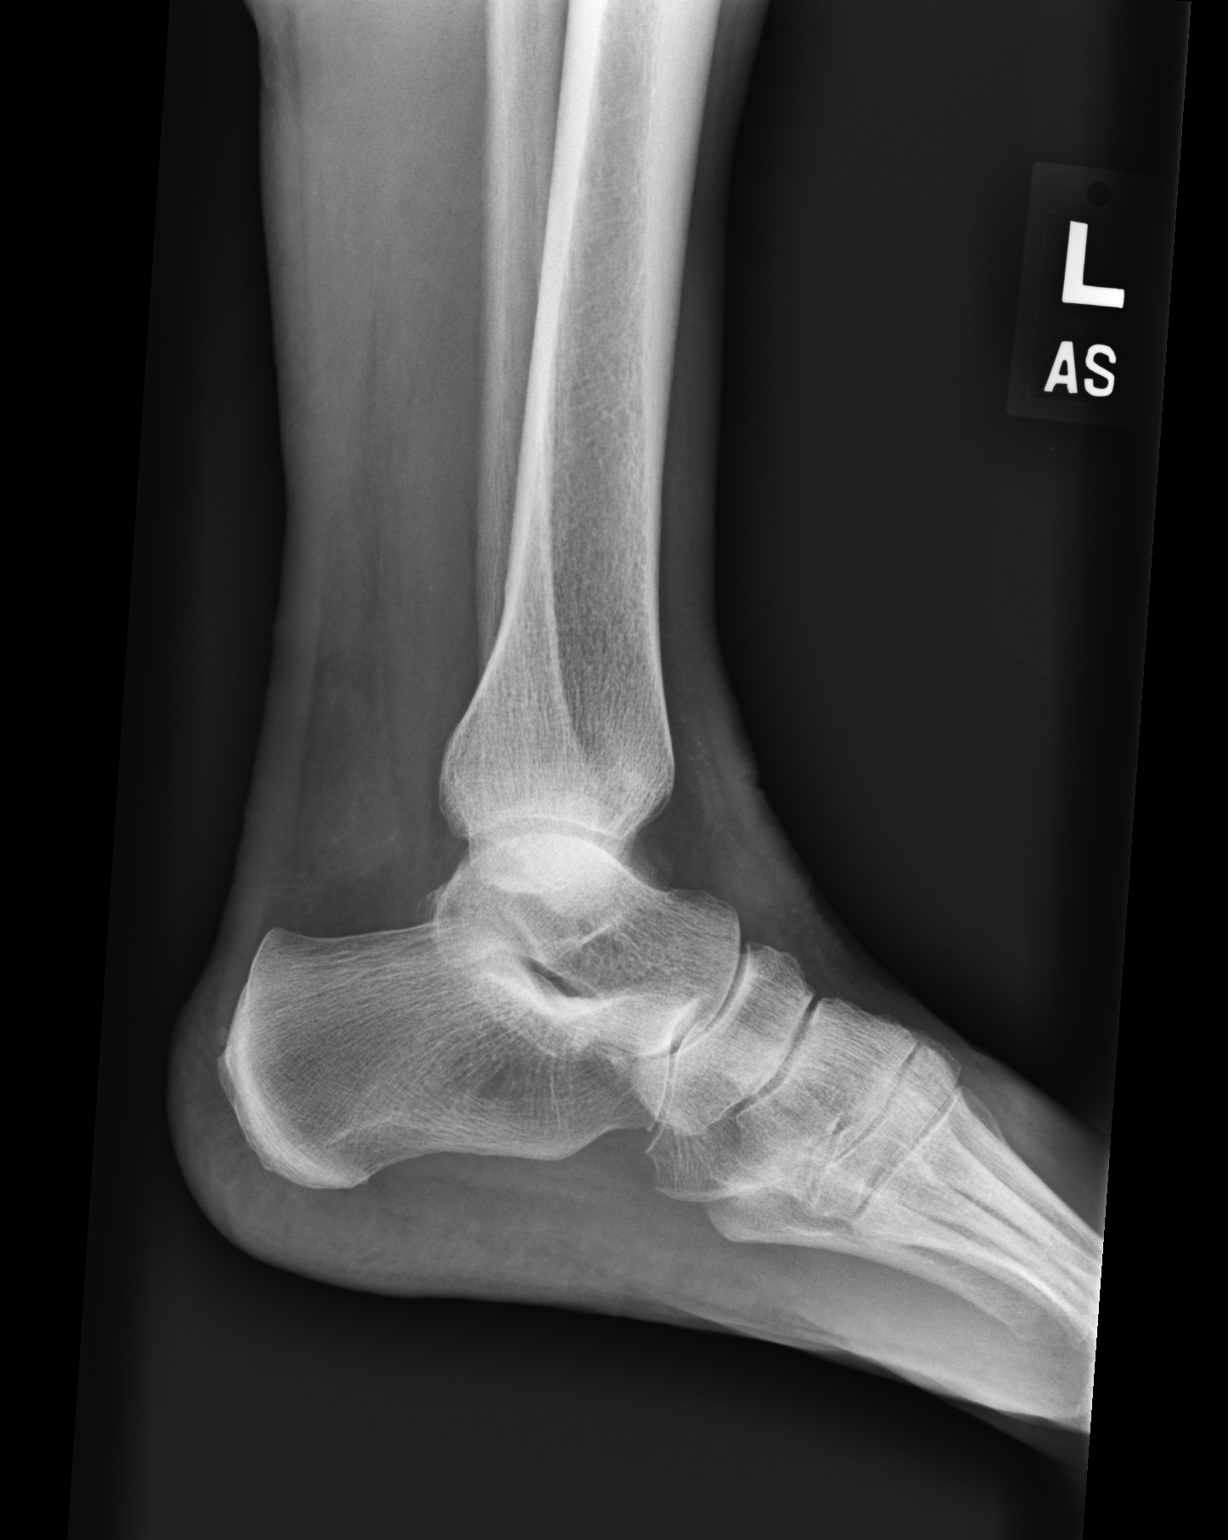

[3 of 3 positions shown; findings below may reference images not displayed]

FINDINGS: There is lateral soft tissue swelling. Left lateral malleolus
demonstrates a small avulsion fracture inferiorly. Normal ankle
alignment. Distal tibia, talus and calcaneus appear intact. No joint
abnormality.
IMPRESSION: Left lateral malleolus tip small avulsion fracture with soft tissue
swelling.

## 2018-12-18 DIAGNOSIS — Z1231 Encounter for screening mammogram for malignant neoplasm of breast: Secondary | ICD-10-CM | POA: Diagnosis not present

## 2018-12-19 ENCOUNTER — Other Ambulatory Visit: Payer: Self-pay

## 2018-12-19 DIAGNOSIS — Z20822 Contact with and (suspected) exposure to covid-19: Secondary | ICD-10-CM

## 2018-12-21 LAB — NOVEL CORONAVIRUS, NAA: SARS-CoV-2, NAA: NOT DETECTED

## 2019-01-24 DIAGNOSIS — E785 Hyperlipidemia, unspecified: Secondary | ICD-10-CM | POA: Diagnosis not present

## 2019-01-24 DIAGNOSIS — M154 Erosive (osteo)arthritis: Secondary | ICD-10-CM | POA: Diagnosis not present

## 2019-01-24 DIAGNOSIS — M1990 Unspecified osteoarthritis, unspecified site: Secondary | ICD-10-CM | POA: Diagnosis not present

## 2019-01-24 DIAGNOSIS — D509 Iron deficiency anemia, unspecified: Secondary | ICD-10-CM | POA: Diagnosis not present

## 2019-01-24 DIAGNOSIS — F4321 Adjustment disorder with depressed mood: Secondary | ICD-10-CM | POA: Diagnosis not present

## 2019-03-03 DIAGNOSIS — R269 Unspecified abnormalities of gait and mobility: Secondary | ICD-10-CM | POA: Diagnosis not present

## 2019-03-03 DIAGNOSIS — M25552 Pain in left hip: Secondary | ICD-10-CM | POA: Diagnosis not present

## 2019-03-05 DIAGNOSIS — M25552 Pain in left hip: Secondary | ICD-10-CM | POA: Diagnosis not present

## 2019-03-05 DIAGNOSIS — R269 Unspecified abnormalities of gait and mobility: Secondary | ICD-10-CM | POA: Diagnosis not present

## 2019-03-11 DIAGNOSIS — M25552 Pain in left hip: Secondary | ICD-10-CM | POA: Diagnosis not present

## 2019-03-11 DIAGNOSIS — R269 Unspecified abnormalities of gait and mobility: Secondary | ICD-10-CM | POA: Diagnosis not present

## 2019-03-12 DIAGNOSIS — D509 Iron deficiency anemia, unspecified: Secondary | ICD-10-CM | POA: Diagnosis not present

## 2019-03-12 DIAGNOSIS — E785 Hyperlipidemia, unspecified: Secondary | ICD-10-CM | POA: Diagnosis not present

## 2019-03-12 DIAGNOSIS — M1990 Unspecified osteoarthritis, unspecified site: Secondary | ICD-10-CM | POA: Diagnosis not present

## 2019-03-12 DIAGNOSIS — M154 Erosive (osteo)arthritis: Secondary | ICD-10-CM | POA: Diagnosis not present

## 2019-03-18 DIAGNOSIS — R269 Unspecified abnormalities of gait and mobility: Secondary | ICD-10-CM | POA: Diagnosis not present

## 2019-03-18 DIAGNOSIS — M25552 Pain in left hip: Secondary | ICD-10-CM | POA: Diagnosis not present

## 2019-03-24 DIAGNOSIS — M25552 Pain in left hip: Secondary | ICD-10-CM | POA: Diagnosis not present

## 2019-03-24 DIAGNOSIS — R269 Unspecified abnormalities of gait and mobility: Secondary | ICD-10-CM | POA: Diagnosis not present

## 2019-03-31 DIAGNOSIS — R269 Unspecified abnormalities of gait and mobility: Secondary | ICD-10-CM | POA: Diagnosis not present

## 2019-03-31 DIAGNOSIS — M25552 Pain in left hip: Secondary | ICD-10-CM | POA: Diagnosis not present

## 2019-04-07 DIAGNOSIS — M25552 Pain in left hip: Secondary | ICD-10-CM | POA: Diagnosis not present

## 2019-04-07 DIAGNOSIS — R269 Unspecified abnormalities of gait and mobility: Secondary | ICD-10-CM | POA: Diagnosis not present

## 2019-04-15 DIAGNOSIS — D509 Iron deficiency anemia, unspecified: Secondary | ICD-10-CM | POA: Diagnosis not present

## 2019-04-15 DIAGNOSIS — E559 Vitamin D deficiency, unspecified: Secondary | ICD-10-CM | POA: Diagnosis not present

## 2019-04-15 DIAGNOSIS — E785 Hyperlipidemia, unspecified: Secondary | ICD-10-CM | POA: Diagnosis not present

## 2019-04-21 DIAGNOSIS — R269 Unspecified abnormalities of gait and mobility: Secondary | ICD-10-CM | POA: Diagnosis not present

## 2019-04-21 DIAGNOSIS — M25552 Pain in left hip: Secondary | ICD-10-CM | POA: Diagnosis not present

## 2019-04-23 DIAGNOSIS — M8588 Other specified disorders of bone density and structure, other site: Secondary | ICD-10-CM | POA: Diagnosis not present

## 2019-04-23 DIAGNOSIS — E785 Hyperlipidemia, unspecified: Secondary | ICD-10-CM | POA: Diagnosis not present

## 2019-04-23 DIAGNOSIS — Z0000 Encounter for general adult medical examination without abnormal findings: Secondary | ICD-10-CM | POA: Diagnosis not present

## 2019-04-23 DIAGNOSIS — E559 Vitamin D deficiency, unspecified: Secondary | ICD-10-CM | POA: Diagnosis not present

## 2019-04-23 DIAGNOSIS — N8110 Cystocele, unspecified: Secondary | ICD-10-CM | POA: Diagnosis not present

## 2019-04-23 DIAGNOSIS — D509 Iron deficiency anemia, unspecified: Secondary | ICD-10-CM | POA: Diagnosis not present

## 2019-04-23 DIAGNOSIS — K219 Gastro-esophageal reflux disease without esophagitis: Secondary | ICD-10-CM | POA: Diagnosis not present

## 2019-04-23 DIAGNOSIS — Z79899 Other long term (current) drug therapy: Secondary | ICD-10-CM | POA: Diagnosis not present

## 2019-04-28 DIAGNOSIS — M85852 Other specified disorders of bone density and structure, left thigh: Secondary | ICD-10-CM | POA: Diagnosis not present

## 2019-04-28 DIAGNOSIS — M85851 Other specified disorders of bone density and structure, right thigh: Secondary | ICD-10-CM | POA: Diagnosis not present

## 2019-05-06 DIAGNOSIS — H40013 Open angle with borderline findings, low risk, bilateral: Secondary | ICD-10-CM | POA: Diagnosis not present

## 2019-05-06 DIAGNOSIS — H26492 Other secondary cataract, left eye: Secondary | ICD-10-CM | POA: Diagnosis not present

## 2019-05-06 DIAGNOSIS — H35373 Puckering of macula, bilateral: Secondary | ICD-10-CM | POA: Diagnosis not present

## 2019-05-19 DIAGNOSIS — R269 Unspecified abnormalities of gait and mobility: Secondary | ICD-10-CM | POA: Diagnosis not present

## 2019-05-19 DIAGNOSIS — M25552 Pain in left hip: Secondary | ICD-10-CM | POA: Diagnosis not present

## 2019-05-29 DIAGNOSIS — H26492 Other secondary cataract, left eye: Secondary | ICD-10-CM | POA: Diagnosis not present

## 2019-06-05 DIAGNOSIS — M2550 Pain in unspecified joint: Secondary | ICD-10-CM | POA: Diagnosis not present

## 2019-06-05 DIAGNOSIS — M79643 Pain in unspecified hand: Secondary | ICD-10-CM | POA: Diagnosis not present

## 2019-06-05 DIAGNOSIS — M8580 Other specified disorders of bone density and structure, unspecified site: Secondary | ICD-10-CM | POA: Diagnosis not present

## 2019-06-05 DIAGNOSIS — M159 Polyosteoarthritis, unspecified: Secondary | ICD-10-CM | POA: Diagnosis not present

## 2019-06-05 DIAGNOSIS — M7062 Trochanteric bursitis, left hip: Secondary | ICD-10-CM | POA: Diagnosis not present

## 2019-06-05 DIAGNOSIS — M154 Erosive (osteo)arthritis: Secondary | ICD-10-CM | POA: Diagnosis not present

## 2019-06-05 DIAGNOSIS — Z791 Long term (current) use of non-steroidal anti-inflammatories (NSAID): Secondary | ICD-10-CM | POA: Diagnosis not present

## 2019-06-05 DIAGNOSIS — K2970 Gastritis, unspecified, without bleeding: Secondary | ICD-10-CM | POA: Diagnosis not present

## 2019-06-06 DIAGNOSIS — H35372 Puckering of macula, left eye: Secondary | ICD-10-CM | POA: Diagnosis not present

## 2019-06-10 DIAGNOSIS — M25552 Pain in left hip: Secondary | ICD-10-CM | POA: Diagnosis not present

## 2019-06-10 DIAGNOSIS — R269 Unspecified abnormalities of gait and mobility: Secondary | ICD-10-CM | POA: Diagnosis not present

## 2019-07-08 DIAGNOSIS — M25552 Pain in left hip: Secondary | ICD-10-CM | POA: Diagnosis not present

## 2019-07-08 DIAGNOSIS — R269 Unspecified abnormalities of gait and mobility: Secondary | ICD-10-CM | POA: Diagnosis not present

## 2019-08-03 DIAGNOSIS — L209 Atopic dermatitis, unspecified: Secondary | ICD-10-CM | POA: Diagnosis not present

## 2019-08-22 DIAGNOSIS — R269 Unspecified abnormalities of gait and mobility: Secondary | ICD-10-CM | POA: Diagnosis not present

## 2019-08-22 DIAGNOSIS — M25552 Pain in left hip: Secondary | ICD-10-CM | POA: Diagnosis not present

## 2019-10-08 DIAGNOSIS — M1990 Unspecified osteoarthritis, unspecified site: Secondary | ICD-10-CM | POA: Diagnosis not present

## 2019-10-08 DIAGNOSIS — M154 Erosive (osteo)arthritis: Secondary | ICD-10-CM | POA: Diagnosis not present

## 2019-10-08 DIAGNOSIS — D509 Iron deficiency anemia, unspecified: Secondary | ICD-10-CM | POA: Diagnosis not present

## 2019-10-08 DIAGNOSIS — E785 Hyperlipidemia, unspecified: Secondary | ICD-10-CM | POA: Diagnosis not present

## 2019-10-31 DIAGNOSIS — M1990 Unspecified osteoarthritis, unspecified site: Secondary | ICD-10-CM | POA: Diagnosis not present

## 2019-10-31 DIAGNOSIS — E785 Hyperlipidemia, unspecified: Secondary | ICD-10-CM | POA: Diagnosis not present

## 2019-10-31 DIAGNOSIS — D509 Iron deficiency anemia, unspecified: Secondary | ICD-10-CM | POA: Diagnosis not present

## 2019-10-31 DIAGNOSIS — M154 Erosive (osteo)arthritis: Secondary | ICD-10-CM | POA: Diagnosis not present

## 2019-11-12 DIAGNOSIS — E785 Hyperlipidemia, unspecified: Secondary | ICD-10-CM | POA: Diagnosis not present

## 2019-11-12 DIAGNOSIS — M1990 Unspecified osteoarthritis, unspecified site: Secondary | ICD-10-CM | POA: Diagnosis not present

## 2019-11-12 DIAGNOSIS — D509 Iron deficiency anemia, unspecified: Secondary | ICD-10-CM | POA: Diagnosis not present

## 2019-11-12 DIAGNOSIS — K219 Gastro-esophageal reflux disease without esophagitis: Secondary | ICD-10-CM | POA: Diagnosis not present

## 2019-11-12 DIAGNOSIS — M154 Erosive (osteo)arthritis: Secondary | ICD-10-CM | POA: Diagnosis not present

## 2019-12-08 DIAGNOSIS — M8580 Other specified disorders of bone density and structure, unspecified site: Secondary | ICD-10-CM | POA: Diagnosis not present

## 2019-12-08 DIAGNOSIS — Z791 Long term (current) use of non-steroidal anti-inflammatories (NSAID): Secondary | ICD-10-CM | POA: Diagnosis not present

## 2019-12-08 DIAGNOSIS — G8929 Other chronic pain: Secondary | ICD-10-CM | POA: Diagnosis not present

## 2019-12-08 DIAGNOSIS — M25842 Other specified joint disorders, left hand: Secondary | ICD-10-CM | POA: Diagnosis not present

## 2019-12-08 DIAGNOSIS — M159 Polyosteoarthritis, unspecified: Secondary | ICD-10-CM | POA: Diagnosis not present

## 2019-12-08 DIAGNOSIS — M154 Erosive (osteo)arthritis: Secondary | ICD-10-CM | POA: Diagnosis not present

## 2019-12-08 DIAGNOSIS — M79641 Pain in right hand: Secondary | ICD-10-CM | POA: Diagnosis not present

## 2019-12-08 DIAGNOSIS — M19042 Primary osteoarthritis, left hand: Secondary | ICD-10-CM | POA: Diagnosis not present

## 2019-12-08 DIAGNOSIS — M19041 Primary osteoarthritis, right hand: Secondary | ICD-10-CM | POA: Diagnosis not present

## 2019-12-08 DIAGNOSIS — M79643 Pain in unspecified hand: Secondary | ICD-10-CM | POA: Diagnosis not present

## 2019-12-08 DIAGNOSIS — M79642 Pain in left hand: Secondary | ICD-10-CM | POA: Diagnosis not present

## 2019-12-08 DIAGNOSIS — M2550 Pain in unspecified joint: Secondary | ICD-10-CM | POA: Diagnosis not present

## 2019-12-08 DIAGNOSIS — K2970 Gastritis, unspecified, without bleeding: Secondary | ICD-10-CM | POA: Diagnosis not present

## 2019-12-09 DIAGNOSIS — H40013 Open angle with borderline findings, low risk, bilateral: Secondary | ICD-10-CM | POA: Diagnosis not present

## 2019-12-09 DIAGNOSIS — H35373 Puckering of macula, bilateral: Secondary | ICD-10-CM | POA: Diagnosis not present

## 2019-12-09 DIAGNOSIS — Z961 Presence of intraocular lens: Secondary | ICD-10-CM | POA: Diagnosis not present

## 2019-12-09 DIAGNOSIS — H52203 Unspecified astigmatism, bilateral: Secondary | ICD-10-CM | POA: Diagnosis not present

## 2019-12-24 DIAGNOSIS — Z1231 Encounter for screening mammogram for malignant neoplasm of breast: Secondary | ICD-10-CM | POA: Diagnosis not present

## 2020-01-20 DIAGNOSIS — Z1152 Encounter for screening for COVID-19: Secondary | ICD-10-CM | POA: Diagnosis not present

## 2020-03-16 DIAGNOSIS — E785 Hyperlipidemia, unspecified: Secondary | ICD-10-CM | POA: Diagnosis not present

## 2020-03-16 DIAGNOSIS — M1990 Unspecified osteoarthritis, unspecified site: Secondary | ICD-10-CM | POA: Diagnosis not present

## 2020-03-16 DIAGNOSIS — D509 Iron deficiency anemia, unspecified: Secondary | ICD-10-CM | POA: Diagnosis not present

## 2020-03-16 DIAGNOSIS — K219 Gastro-esophageal reflux disease without esophagitis: Secondary | ICD-10-CM | POA: Diagnosis not present

## 2020-03-16 DIAGNOSIS — M154 Erosive (osteo)arthritis: Secondary | ICD-10-CM | POA: Diagnosis not present

## 2020-04-29 DIAGNOSIS — E785 Hyperlipidemia, unspecified: Secondary | ICD-10-CM | POA: Diagnosis not present

## 2020-04-29 DIAGNOSIS — E559 Vitamin D deficiency, unspecified: Secondary | ICD-10-CM | POA: Diagnosis not present

## 2020-04-29 DIAGNOSIS — D509 Iron deficiency anemia, unspecified: Secondary | ICD-10-CM | POA: Diagnosis not present

## 2020-04-29 DIAGNOSIS — F5101 Primary insomnia: Secondary | ICD-10-CM | POA: Diagnosis not present

## 2020-04-29 DIAGNOSIS — Z79899 Other long term (current) drug therapy: Secondary | ICD-10-CM | POA: Diagnosis not present

## 2020-04-29 DIAGNOSIS — K219 Gastro-esophageal reflux disease without esophagitis: Secondary | ICD-10-CM | POA: Diagnosis not present

## 2020-04-29 DIAGNOSIS — Z0000 Encounter for general adult medical examination without abnormal findings: Secondary | ICD-10-CM | POA: Diagnosis not present

## 2020-04-29 DIAGNOSIS — M8588 Other specified disorders of bone density and structure, other site: Secondary | ICD-10-CM | POA: Diagnosis not present

## 2020-05-14 DIAGNOSIS — J0110 Acute frontal sinusitis, unspecified: Secondary | ICD-10-CM | POA: Diagnosis not present

## 2020-05-14 DIAGNOSIS — R058 Other specified cough: Secondary | ICD-10-CM | POA: Diagnosis not present

## 2020-05-14 DIAGNOSIS — Z20822 Contact with and (suspected) exposure to covid-19: Secondary | ICD-10-CM | POA: Diagnosis not present

## 2020-05-14 DIAGNOSIS — R0981 Nasal congestion: Secondary | ICD-10-CM | POA: Diagnosis not present

## 2020-06-23 DIAGNOSIS — M1990 Unspecified osteoarthritis, unspecified site: Secondary | ICD-10-CM | POA: Diagnosis not present

## 2020-06-23 DIAGNOSIS — M154 Erosive (osteo)arthritis: Secondary | ICD-10-CM | POA: Diagnosis not present

## 2020-06-23 DIAGNOSIS — E785 Hyperlipidemia, unspecified: Secondary | ICD-10-CM | POA: Diagnosis not present

## 2020-06-23 DIAGNOSIS — K219 Gastro-esophageal reflux disease without esophagitis: Secondary | ICD-10-CM | POA: Diagnosis not present

## 2020-06-23 DIAGNOSIS — D509 Iron deficiency anemia, unspecified: Secondary | ICD-10-CM | POA: Diagnosis not present

## 2020-07-26 DIAGNOSIS — R051 Acute cough: Secondary | ICD-10-CM | POA: Diagnosis not present

## 2020-07-26 DIAGNOSIS — R5383 Other fatigue: Secondary | ICD-10-CM | POA: Diagnosis not present

## 2020-07-26 DIAGNOSIS — U071 COVID-19: Secondary | ICD-10-CM | POA: Diagnosis not present

## 2020-08-16 DIAGNOSIS — M2550 Pain in unspecified joint: Secondary | ICD-10-CM | POA: Diagnosis not present

## 2020-08-16 DIAGNOSIS — Z791 Long term (current) use of non-steroidal anti-inflammatories (NSAID): Secondary | ICD-10-CM | POA: Diagnosis not present

## 2020-08-16 DIAGNOSIS — M159 Polyosteoarthritis, unspecified: Secondary | ICD-10-CM | POA: Diagnosis not present

## 2020-08-16 DIAGNOSIS — K2970 Gastritis, unspecified, without bleeding: Secondary | ICD-10-CM | POA: Diagnosis not present

## 2020-08-16 DIAGNOSIS — E785 Hyperlipidemia, unspecified: Secondary | ICD-10-CM | POA: Diagnosis not present

## 2020-08-16 DIAGNOSIS — M8580 Other specified disorders of bone density and structure, unspecified site: Secondary | ICD-10-CM | POA: Diagnosis not present

## 2020-08-16 DIAGNOSIS — M79643 Pain in unspecified hand: Secondary | ICD-10-CM | POA: Diagnosis not present

## 2020-08-16 DIAGNOSIS — M154 Erosive (osteo)arthritis: Secondary | ICD-10-CM | POA: Diagnosis not present

## 2020-11-08 DIAGNOSIS — M1990 Unspecified osteoarthritis, unspecified site: Secondary | ICD-10-CM | POA: Diagnosis not present

## 2020-11-08 DIAGNOSIS — E785 Hyperlipidemia, unspecified: Secondary | ICD-10-CM | POA: Diagnosis not present

## 2020-11-08 DIAGNOSIS — M154 Erosive (osteo)arthritis: Secondary | ICD-10-CM | POA: Diagnosis not present

## 2020-11-08 DIAGNOSIS — K219 Gastro-esophageal reflux disease without esophagitis: Secondary | ICD-10-CM | POA: Diagnosis not present

## 2020-11-08 DIAGNOSIS — D509 Iron deficiency anemia, unspecified: Secondary | ICD-10-CM | POA: Diagnosis not present

## 2020-12-15 DIAGNOSIS — H5213 Myopia, bilateral: Secondary | ICD-10-CM | POA: Diagnosis not present

## 2020-12-15 DIAGNOSIS — H35373 Puckering of macula, bilateral: Secondary | ICD-10-CM | POA: Diagnosis not present

## 2020-12-15 DIAGNOSIS — H40013 Open angle with borderline findings, low risk, bilateral: Secondary | ICD-10-CM | POA: Diagnosis not present

## 2020-12-15 DIAGNOSIS — Z961 Presence of intraocular lens: Secondary | ICD-10-CM | POA: Diagnosis not present

## 2020-12-29 DIAGNOSIS — Z1231 Encounter for screening mammogram for malignant neoplasm of breast: Secondary | ICD-10-CM | POA: Diagnosis not present

## 2021-02-16 DIAGNOSIS — M159 Polyosteoarthritis, unspecified: Secondary | ICD-10-CM | POA: Diagnosis not present

## 2021-02-16 DIAGNOSIS — M8580 Other specified disorders of bone density and structure, unspecified site: Secondary | ICD-10-CM | POA: Diagnosis not present

## 2021-02-16 DIAGNOSIS — Z791 Long term (current) use of non-steroidal anti-inflammatories (NSAID): Secondary | ICD-10-CM | POA: Diagnosis not present

## 2021-02-16 DIAGNOSIS — M154 Erosive (osteo)arthritis: Secondary | ICD-10-CM | POA: Diagnosis not present

## 2021-02-16 DIAGNOSIS — K2970 Gastritis, unspecified, without bleeding: Secondary | ICD-10-CM | POA: Diagnosis not present

## 2021-02-16 DIAGNOSIS — M79643 Pain in unspecified hand: Secondary | ICD-10-CM | POA: Diagnosis not present

## 2021-02-16 DIAGNOSIS — M2550 Pain in unspecified joint: Secondary | ICD-10-CM | POA: Diagnosis not present

## 2021-02-28 DIAGNOSIS — Z23 Encounter for immunization: Secondary | ICD-10-CM | POA: Diagnosis not present

## 2021-02-28 DIAGNOSIS — Z7189 Other specified counseling: Secondary | ICD-10-CM | POA: Diagnosis not present

## 2021-03-17 DIAGNOSIS — M545 Low back pain, unspecified: Secondary | ICD-10-CM | POA: Diagnosis not present

## 2021-03-22 DIAGNOSIS — M545 Low back pain, unspecified: Secondary | ICD-10-CM | POA: Diagnosis not present

## 2021-03-22 DIAGNOSIS — M25552 Pain in left hip: Secondary | ICD-10-CM | POA: Diagnosis not present

## 2021-03-23 DIAGNOSIS — E785 Hyperlipidemia, unspecified: Secondary | ICD-10-CM | POA: Diagnosis not present

## 2021-03-23 DIAGNOSIS — F5101 Primary insomnia: Secondary | ICD-10-CM | POA: Diagnosis not present

## 2021-03-28 DIAGNOSIS — Z23 Encounter for immunization: Secondary | ICD-10-CM | POA: Diagnosis not present

## 2021-03-29 DIAGNOSIS — M545 Low back pain, unspecified: Secondary | ICD-10-CM | POA: Diagnosis not present

## 2021-03-29 DIAGNOSIS — M25552 Pain in left hip: Secondary | ICD-10-CM | POA: Diagnosis not present

## 2021-04-13 DIAGNOSIS — M545 Low back pain, unspecified: Secondary | ICD-10-CM | POA: Diagnosis not present

## 2021-04-13 DIAGNOSIS — M25552 Pain in left hip: Secondary | ICD-10-CM | POA: Diagnosis not present

## 2021-05-11 DIAGNOSIS — E559 Vitamin D deficiency, unspecified: Secondary | ICD-10-CM | POA: Diagnosis not present

## 2021-05-11 DIAGNOSIS — D509 Iron deficiency anemia, unspecified: Secondary | ICD-10-CM | POA: Diagnosis not present

## 2021-05-11 DIAGNOSIS — Z79899 Other long term (current) drug therapy: Secondary | ICD-10-CM | POA: Diagnosis not present

## 2021-05-11 DIAGNOSIS — E785 Hyperlipidemia, unspecified: Secondary | ICD-10-CM | POA: Diagnosis not present

## 2021-05-11 DIAGNOSIS — Z1159 Encounter for screening for other viral diseases: Secondary | ICD-10-CM | POA: Diagnosis not present

## 2021-05-13 DIAGNOSIS — E785 Hyperlipidemia, unspecified: Secondary | ICD-10-CM | POA: Diagnosis not present

## 2021-05-13 DIAGNOSIS — F5101 Primary insomnia: Secondary | ICD-10-CM | POA: Diagnosis not present

## 2021-05-13 DIAGNOSIS — L989 Disorder of the skin and subcutaneous tissue, unspecified: Secondary | ICD-10-CM | POA: Diagnosis not present

## 2021-05-13 DIAGNOSIS — K219 Gastro-esophageal reflux disease without esophagitis: Secondary | ICD-10-CM | POA: Diagnosis not present

## 2021-05-13 DIAGNOSIS — N3281 Overactive bladder: Secondary | ICD-10-CM | POA: Diagnosis not present

## 2021-05-13 DIAGNOSIS — Z0000 Encounter for general adult medical examination without abnormal findings: Secondary | ICD-10-CM | POA: Diagnosis not present

## 2021-05-13 DIAGNOSIS — M1990 Unspecified osteoarthritis, unspecified site: Secondary | ICD-10-CM | POA: Diagnosis not present

## 2021-05-13 DIAGNOSIS — N8110 Cystocele, unspecified: Secondary | ICD-10-CM | POA: Diagnosis not present

## 2021-05-13 DIAGNOSIS — M8588 Other specified disorders of bone density and structure, other site: Secondary | ICD-10-CM | POA: Diagnosis not present

## 2021-05-16 DIAGNOSIS — M25552 Pain in left hip: Secondary | ICD-10-CM | POA: Diagnosis not present

## 2021-05-16 DIAGNOSIS — M545 Low back pain, unspecified: Secondary | ICD-10-CM | POA: Diagnosis not present

## 2021-05-26 DIAGNOSIS — M25552 Pain in left hip: Secondary | ICD-10-CM | POA: Diagnosis not present

## 2021-05-26 DIAGNOSIS — M545 Low back pain, unspecified: Secondary | ICD-10-CM | POA: Diagnosis not present

## 2021-05-30 DIAGNOSIS — Z780 Asymptomatic menopausal state: Secondary | ICD-10-CM | POA: Diagnosis not present

## 2021-05-30 DIAGNOSIS — M85851 Other specified disorders of bone density and structure, right thigh: Secondary | ICD-10-CM | POA: Diagnosis not present

## 2021-05-30 DIAGNOSIS — M85852 Other specified disorders of bone density and structure, left thigh: Secondary | ICD-10-CM | POA: Diagnosis not present

## 2021-05-31 DIAGNOSIS — L570 Actinic keratosis: Secondary | ICD-10-CM | POA: Diagnosis not present

## 2021-05-31 DIAGNOSIS — L820 Inflamed seborrheic keratosis: Secondary | ICD-10-CM | POA: Diagnosis not present

## 2021-05-31 DIAGNOSIS — L821 Other seborrheic keratosis: Secondary | ICD-10-CM | POA: Diagnosis not present

## 2021-06-09 DIAGNOSIS — M545 Low back pain, unspecified: Secondary | ICD-10-CM | POA: Diagnosis not present

## 2021-06-09 DIAGNOSIS — M25552 Pain in left hip: Secondary | ICD-10-CM | POA: Diagnosis not present

## 2021-06-14 DIAGNOSIS — Z961 Presence of intraocular lens: Secondary | ICD-10-CM | POA: Diagnosis not present

## 2021-06-14 DIAGNOSIS — H40023 Open angle with borderline findings, high risk, bilateral: Secondary | ICD-10-CM | POA: Diagnosis not present

## 2021-06-21 DIAGNOSIS — L814 Other melanin hyperpigmentation: Secondary | ICD-10-CM | POA: Diagnosis not present

## 2021-06-21 DIAGNOSIS — D1801 Hemangioma of skin and subcutaneous tissue: Secondary | ICD-10-CM | POA: Diagnosis not present

## 2021-06-21 DIAGNOSIS — L578 Other skin changes due to chronic exposure to nonionizing radiation: Secondary | ICD-10-CM | POA: Diagnosis not present

## 2021-06-21 DIAGNOSIS — L821 Other seborrheic keratosis: Secondary | ICD-10-CM | POA: Diagnosis not present

## 2021-06-21 DIAGNOSIS — D229 Melanocytic nevi, unspecified: Secondary | ICD-10-CM | POA: Diagnosis not present

## 2021-07-11 DIAGNOSIS — M25552 Pain in left hip: Secondary | ICD-10-CM | POA: Diagnosis not present

## 2021-07-11 DIAGNOSIS — M545 Low back pain, unspecified: Secondary | ICD-10-CM | POA: Diagnosis not present

## 2021-08-16 DIAGNOSIS — M2550 Pain in unspecified joint: Secondary | ICD-10-CM | POA: Diagnosis not present

## 2021-08-16 DIAGNOSIS — M79643 Pain in unspecified hand: Secondary | ICD-10-CM | POA: Diagnosis not present

## 2021-08-16 DIAGNOSIS — K2970 Gastritis, unspecified, without bleeding: Secondary | ICD-10-CM | POA: Diagnosis not present

## 2021-08-16 DIAGNOSIS — M159 Polyosteoarthritis, unspecified: Secondary | ICD-10-CM | POA: Diagnosis not present

## 2021-08-16 DIAGNOSIS — M8580 Other specified disorders of bone density and structure, unspecified site: Secondary | ICD-10-CM | POA: Diagnosis not present

## 2021-08-16 DIAGNOSIS — M154 Erosive (osteo)arthritis: Secondary | ICD-10-CM | POA: Diagnosis not present

## 2021-08-16 DIAGNOSIS — Z791 Long term (current) use of non-steroidal anti-inflammatories (NSAID): Secondary | ICD-10-CM | POA: Diagnosis not present

## 2021-08-24 DIAGNOSIS — M159 Polyosteoarthritis, unspecified: Secondary | ICD-10-CM | POA: Diagnosis not present

## 2021-08-24 DIAGNOSIS — E785 Hyperlipidemia, unspecified: Secondary | ICD-10-CM | POA: Diagnosis not present

## 2021-08-30 DIAGNOSIS — M545 Low back pain, unspecified: Secondary | ICD-10-CM | POA: Diagnosis not present

## 2021-08-30 DIAGNOSIS — M25552 Pain in left hip: Secondary | ICD-10-CM | POA: Diagnosis not present

## 2021-09-28 DIAGNOSIS — M25552 Pain in left hip: Secondary | ICD-10-CM | POA: Diagnosis not present

## 2021-09-28 DIAGNOSIS — M545 Low back pain, unspecified: Secondary | ICD-10-CM | POA: Diagnosis not present

## 2021-10-18 DIAGNOSIS — M25552 Pain in left hip: Secondary | ICD-10-CM | POA: Diagnosis not present

## 2021-10-18 DIAGNOSIS — M545 Low back pain, unspecified: Secondary | ICD-10-CM | POA: Diagnosis not present

## 2021-11-10 DIAGNOSIS — H6691 Otitis media, unspecified, right ear: Secondary | ICD-10-CM | POA: Diagnosis not present

## 2021-11-16 DIAGNOSIS — M25552 Pain in left hip: Secondary | ICD-10-CM | POA: Diagnosis not present

## 2021-11-16 DIAGNOSIS — M545 Low back pain, unspecified: Secondary | ICD-10-CM | POA: Diagnosis not present

## 2021-12-08 DIAGNOSIS — M25552 Pain in left hip: Secondary | ICD-10-CM | POA: Diagnosis not present

## 2021-12-08 DIAGNOSIS — M545 Low back pain, unspecified: Secondary | ICD-10-CM | POA: Diagnosis not present

## 2021-12-16 DIAGNOSIS — E785 Hyperlipidemia, unspecified: Secondary | ICD-10-CM | POA: Diagnosis not present

## 2021-12-20 DIAGNOSIS — Z7184 Encounter for health counseling related to travel: Secondary | ICD-10-CM | POA: Diagnosis not present

## 2021-12-20 DIAGNOSIS — Z79899 Other long term (current) drug therapy: Secondary | ICD-10-CM | POA: Diagnosis not present

## 2021-12-20 DIAGNOSIS — E785 Hyperlipidemia, unspecified: Secondary | ICD-10-CM | POA: Diagnosis not present

## 2021-12-20 DIAGNOSIS — H6991 Unspecified Eustachian tube disorder, right ear: Secondary | ICD-10-CM | POA: Diagnosis not present

## 2021-12-20 DIAGNOSIS — M67431 Ganglion, right wrist: Secondary | ICD-10-CM | POA: Diagnosis not present

## 2021-12-21 DIAGNOSIS — H40013 Open angle with borderline findings, low risk, bilateral: Secondary | ICD-10-CM | POA: Diagnosis not present

## 2022-02-03 DIAGNOSIS — Z1231 Encounter for screening mammogram for malignant neoplasm of breast: Secondary | ICD-10-CM | POA: Diagnosis not present

## 2022-02-07 DIAGNOSIS — M545 Low back pain, unspecified: Secondary | ICD-10-CM | POA: Diagnosis not present

## 2022-02-07 DIAGNOSIS — M25552 Pain in left hip: Secondary | ICD-10-CM | POA: Diagnosis not present

## 2022-02-16 DIAGNOSIS — M154 Erosive (osteo)arthritis: Secondary | ICD-10-CM | POA: Diagnosis not present

## 2022-02-16 DIAGNOSIS — M2550 Pain in unspecified joint: Secondary | ICD-10-CM | POA: Diagnosis not present

## 2022-02-16 DIAGNOSIS — M79643 Pain in unspecified hand: Secondary | ICD-10-CM | POA: Diagnosis not present

## 2022-02-16 DIAGNOSIS — K2970 Gastritis, unspecified, without bleeding: Secondary | ICD-10-CM | POA: Diagnosis not present

## 2022-02-16 DIAGNOSIS — M159 Polyosteoarthritis, unspecified: Secondary | ICD-10-CM | POA: Diagnosis not present

## 2022-02-16 DIAGNOSIS — Z791 Long term (current) use of non-steroidal anti-inflammatories (NSAID): Secondary | ICD-10-CM | POA: Diagnosis not present

## 2022-02-16 DIAGNOSIS — M8580 Other specified disorders of bone density and structure, unspecified site: Secondary | ICD-10-CM | POA: Diagnosis not present

## 2022-02-24 DIAGNOSIS — M67431 Ganglion, right wrist: Secondary | ICD-10-CM | POA: Diagnosis not present

## 2022-03-09 DIAGNOSIS — M25552 Pain in left hip: Secondary | ICD-10-CM | POA: Diagnosis not present

## 2022-03-09 DIAGNOSIS — M545 Low back pain, unspecified: Secondary | ICD-10-CM | POA: Diagnosis not present

## 2022-03-17 DIAGNOSIS — M13831 Other specified arthritis, right wrist: Secondary | ICD-10-CM | POA: Diagnosis not present

## 2022-03-17 DIAGNOSIS — M67431 Ganglion, right wrist: Secondary | ICD-10-CM | POA: Diagnosis not present

## 2022-04-06 DIAGNOSIS — M25552 Pain in left hip: Secondary | ICD-10-CM | POA: Diagnosis not present

## 2022-04-06 DIAGNOSIS — M545 Low back pain, unspecified: Secondary | ICD-10-CM | POA: Diagnosis not present

## 2022-05-04 DIAGNOSIS — M25552 Pain in left hip: Secondary | ICD-10-CM | POA: Diagnosis not present

## 2022-05-04 DIAGNOSIS — M545 Low back pain, unspecified: Secondary | ICD-10-CM | POA: Diagnosis not present

## 2022-05-25 DIAGNOSIS — Z79899 Other long term (current) drug therapy: Secondary | ICD-10-CM | POA: Diagnosis not present

## 2022-05-25 DIAGNOSIS — D509 Iron deficiency anemia, unspecified: Secondary | ICD-10-CM | POA: Diagnosis not present

## 2022-05-25 DIAGNOSIS — K219 Gastro-esophageal reflux disease without esophagitis: Secondary | ICD-10-CM | POA: Diagnosis not present

## 2022-05-25 DIAGNOSIS — E785 Hyperlipidemia, unspecified: Secondary | ICD-10-CM | POA: Diagnosis not present

## 2022-05-29 DIAGNOSIS — M1990 Unspecified osteoarthritis, unspecified site: Secondary | ICD-10-CM | POA: Diagnosis not present

## 2022-05-29 DIAGNOSIS — E669 Obesity, unspecified: Secondary | ICD-10-CM | POA: Diagnosis not present

## 2022-05-29 DIAGNOSIS — Z9181 History of falling: Secondary | ICD-10-CM | POA: Diagnosis not present

## 2022-05-29 DIAGNOSIS — F4321 Adjustment disorder with depressed mood: Secondary | ICD-10-CM | POA: Diagnosis not present

## 2022-05-29 DIAGNOSIS — E785 Hyperlipidemia, unspecified: Secondary | ICD-10-CM | POA: Diagnosis not present

## 2022-05-29 DIAGNOSIS — M67431 Ganglion, right wrist: Secondary | ICD-10-CM | POA: Diagnosis not present

## 2022-05-29 DIAGNOSIS — D509 Iron deficiency anemia, unspecified: Secondary | ICD-10-CM | POA: Diagnosis not present

## 2022-05-29 DIAGNOSIS — E559 Vitamin D deficiency, unspecified: Secondary | ICD-10-CM | POA: Diagnosis not present

## 2022-05-29 DIAGNOSIS — K219 Gastro-esophageal reflux disease without esophagitis: Secondary | ICD-10-CM | POA: Diagnosis not present

## 2022-05-29 DIAGNOSIS — Z0000 Encounter for general adult medical examination without abnormal findings: Secondary | ICD-10-CM | POA: Diagnosis not present

## 2022-05-29 DIAGNOSIS — Z79899 Other long term (current) drug therapy: Secondary | ICD-10-CM | POA: Diagnosis not present

## 2022-05-29 DIAGNOSIS — M8588 Other specified disorders of bone density and structure, other site: Secondary | ICD-10-CM | POA: Diagnosis not present

## 2022-06-01 DIAGNOSIS — M25552 Pain in left hip: Secondary | ICD-10-CM | POA: Diagnosis not present

## 2022-06-01 DIAGNOSIS — M545 Low back pain, unspecified: Secondary | ICD-10-CM | POA: Diagnosis not present

## 2022-06-22 DIAGNOSIS — L814 Other melanin hyperpigmentation: Secondary | ICD-10-CM | POA: Diagnosis not present

## 2022-06-22 DIAGNOSIS — D1801 Hemangioma of skin and subcutaneous tissue: Secondary | ICD-10-CM | POA: Diagnosis not present

## 2022-06-22 DIAGNOSIS — L918 Other hypertrophic disorders of the skin: Secondary | ICD-10-CM | POA: Diagnosis not present

## 2022-06-22 DIAGNOSIS — L821 Other seborrheic keratosis: Secondary | ICD-10-CM | POA: Diagnosis not present

## 2022-06-22 DIAGNOSIS — D229 Melanocytic nevi, unspecified: Secondary | ICD-10-CM | POA: Diagnosis not present

## 2022-06-22 DIAGNOSIS — L578 Other skin changes due to chronic exposure to nonionizing radiation: Secondary | ICD-10-CM | POA: Diagnosis not present

## 2022-06-22 DIAGNOSIS — C44219 Basal cell carcinoma of skin of left ear and external auricular canal: Secondary | ICD-10-CM | POA: Diagnosis not present

## 2022-06-22 DIAGNOSIS — D485 Neoplasm of uncertain behavior of skin: Secondary | ICD-10-CM | POA: Diagnosis not present

## 2022-06-22 DIAGNOSIS — L820 Inflamed seborrheic keratosis: Secondary | ICD-10-CM | POA: Diagnosis not present

## 2022-06-23 DIAGNOSIS — Z961 Presence of intraocular lens: Secondary | ICD-10-CM | POA: Diagnosis not present

## 2022-06-23 DIAGNOSIS — H35373 Puckering of macula, bilateral: Secondary | ICD-10-CM | POA: Diagnosis not present

## 2022-06-23 DIAGNOSIS — H40013 Open angle with borderline findings, low risk, bilateral: Secondary | ICD-10-CM | POA: Diagnosis not present

## 2022-06-23 DIAGNOSIS — H52203 Unspecified astigmatism, bilateral: Secondary | ICD-10-CM | POA: Diagnosis not present

## 2022-06-30 DIAGNOSIS — M25552 Pain in left hip: Secondary | ICD-10-CM | POA: Diagnosis not present

## 2022-06-30 DIAGNOSIS — M545 Low back pain, unspecified: Secondary | ICD-10-CM | POA: Diagnosis not present

## 2022-08-02 DIAGNOSIS — C44219 Basal cell carcinoma of skin of left ear and external auricular canal: Secondary | ICD-10-CM | POA: Diagnosis not present

## 2022-08-09 DIAGNOSIS — M25552 Pain in left hip: Secondary | ICD-10-CM | POA: Diagnosis not present

## 2022-08-09 DIAGNOSIS — M545 Low back pain, unspecified: Secondary | ICD-10-CM | POA: Diagnosis not present

## 2022-08-10 DIAGNOSIS — K2970 Gastritis, unspecified, without bleeding: Secondary | ICD-10-CM | POA: Diagnosis not present

## 2022-08-10 DIAGNOSIS — M79643 Pain in unspecified hand: Secondary | ICD-10-CM | POA: Diagnosis not present

## 2022-08-10 DIAGNOSIS — M159 Polyosteoarthritis, unspecified: Secondary | ICD-10-CM | POA: Diagnosis not present

## 2022-08-10 DIAGNOSIS — M8580 Other specified disorders of bone density and structure, unspecified site: Secondary | ICD-10-CM | POA: Diagnosis not present

## 2022-08-10 DIAGNOSIS — M2550 Pain in unspecified joint: Secondary | ICD-10-CM | POA: Diagnosis not present

## 2022-08-10 DIAGNOSIS — M154 Erosive (osteo)arthritis: Secondary | ICD-10-CM | POA: Diagnosis not present

## 2022-08-10 DIAGNOSIS — Z791 Long term (current) use of non-steroidal anti-inflammatories (NSAID): Secondary | ICD-10-CM | POA: Diagnosis not present

## 2022-08-10 DIAGNOSIS — M6740 Ganglion, unspecified site: Secondary | ICD-10-CM | POA: Diagnosis not present

## 2022-09-06 DIAGNOSIS — M25552 Pain in left hip: Secondary | ICD-10-CM | POA: Diagnosis not present

## 2022-09-06 DIAGNOSIS — M545 Low back pain, unspecified: Secondary | ICD-10-CM | POA: Diagnosis not present

## 2022-10-18 DIAGNOSIS — R748 Abnormal levels of other serum enzymes: Secondary | ICD-10-CM | POA: Diagnosis not present

## 2022-10-19 DIAGNOSIS — M545 Low back pain, unspecified: Secondary | ICD-10-CM | POA: Diagnosis not present

## 2022-10-19 DIAGNOSIS — M25552 Pain in left hip: Secondary | ICD-10-CM | POA: Diagnosis not present

## 2022-10-30 DIAGNOSIS — Z23 Encounter for immunization: Secondary | ICD-10-CM | POA: Diagnosis not present

## 2022-10-31 DIAGNOSIS — M19031 Primary osteoarthritis, right wrist: Secondary | ICD-10-CM | POA: Diagnosis not present

## 2022-11-02 DIAGNOSIS — M25531 Pain in right wrist: Secondary | ICD-10-CM | POA: Diagnosis not present

## 2022-11-15 DIAGNOSIS — M545 Low back pain, unspecified: Secondary | ICD-10-CM | POA: Diagnosis not present

## 2022-11-15 DIAGNOSIS — M25552 Pain in left hip: Secondary | ICD-10-CM | POA: Diagnosis not present

## 2022-12-05 DIAGNOSIS — M1811 Unilateral primary osteoarthritis of first carpometacarpal joint, right hand: Secondary | ICD-10-CM | POA: Diagnosis not present

## 2022-12-26 DIAGNOSIS — M25552 Pain in left hip: Secondary | ICD-10-CM | POA: Diagnosis not present

## 2022-12-26 DIAGNOSIS — M545 Low back pain, unspecified: Secondary | ICD-10-CM | POA: Diagnosis not present

## 2023-01-09 DIAGNOSIS — L309 Dermatitis, unspecified: Secondary | ICD-10-CM | POA: Diagnosis not present

## 2023-02-15 DIAGNOSIS — M159 Polyosteoarthritis, unspecified: Secondary | ICD-10-CM | POA: Diagnosis not present

## 2023-02-15 DIAGNOSIS — Z791 Long term (current) use of non-steroidal anti-inflammatories (NSAID): Secondary | ICD-10-CM | POA: Diagnosis not present

## 2023-03-20 DIAGNOSIS — M25552 Pain in left hip: Secondary | ICD-10-CM | POA: Diagnosis not present

## 2023-03-20 DIAGNOSIS — M545 Low back pain, unspecified: Secondary | ICD-10-CM | POA: Diagnosis not present

## 2023-03-21 DIAGNOSIS — R748 Abnormal levels of other serum enzymes: Secondary | ICD-10-CM | POA: Diagnosis not present

## 2023-04-02 DIAGNOSIS — L309 Dermatitis, unspecified: Secondary | ICD-10-CM | POA: Diagnosis not present

## 2023-05-02 DIAGNOSIS — M545 Low back pain, unspecified: Secondary | ICD-10-CM | POA: Diagnosis not present

## 2023-05-02 DIAGNOSIS — M25552 Pain in left hip: Secondary | ICD-10-CM | POA: Diagnosis not present

## 2023-05-30 DIAGNOSIS — M545 Low back pain, unspecified: Secondary | ICD-10-CM | POA: Diagnosis not present

## 2023-05-30 DIAGNOSIS — M25552 Pain in left hip: Secondary | ICD-10-CM | POA: Diagnosis not present

## 2023-06-06 DIAGNOSIS — E559 Vitamin D deficiency, unspecified: Secondary | ICD-10-CM | POA: Diagnosis not present

## 2023-06-06 DIAGNOSIS — E785 Hyperlipidemia, unspecified: Secondary | ICD-10-CM | POA: Diagnosis not present

## 2023-06-06 DIAGNOSIS — Z79899 Other long term (current) drug therapy: Secondary | ICD-10-CM | POA: Diagnosis not present

## 2023-06-06 DIAGNOSIS — D509 Iron deficiency anemia, unspecified: Secondary | ICD-10-CM | POA: Diagnosis not present

## 2023-06-15 DIAGNOSIS — M8588 Other specified disorders of bone density and structure, other site: Secondary | ICD-10-CM | POA: Diagnosis not present

## 2023-06-15 DIAGNOSIS — R7301 Impaired fasting glucose: Secondary | ICD-10-CM | POA: Diagnosis not present

## 2023-06-15 DIAGNOSIS — Z0000 Encounter for general adult medical examination without abnormal findings: Secondary | ICD-10-CM | POA: Diagnosis not present

## 2023-06-15 DIAGNOSIS — D509 Iron deficiency anemia, unspecified: Secondary | ICD-10-CM | POA: Diagnosis not present

## 2023-06-15 DIAGNOSIS — M1990 Unspecified osteoarthritis, unspecified site: Secondary | ICD-10-CM | POA: Diagnosis not present

## 2023-06-15 DIAGNOSIS — K219 Gastro-esophageal reflux disease without esophagitis: Secondary | ICD-10-CM | POA: Diagnosis not present

## 2023-06-15 DIAGNOSIS — E559 Vitamin D deficiency, unspecified: Secondary | ICD-10-CM | POA: Diagnosis not present

## 2023-06-15 DIAGNOSIS — E785 Hyperlipidemia, unspecified: Secondary | ICD-10-CM | POA: Diagnosis not present

## 2023-06-15 DIAGNOSIS — N3281 Overactive bladder: Secondary | ICD-10-CM | POA: Diagnosis not present

## 2023-06-15 DIAGNOSIS — Z23 Encounter for immunization: Secondary | ICD-10-CM | POA: Diagnosis not present

## 2023-06-20 DIAGNOSIS — D1801 Hemangioma of skin and subcutaneous tissue: Secondary | ICD-10-CM | POA: Diagnosis not present

## 2023-06-20 DIAGNOSIS — L309 Dermatitis, unspecified: Secondary | ICD-10-CM | POA: Diagnosis not present

## 2023-06-20 DIAGNOSIS — Z85828 Personal history of other malignant neoplasm of skin: Secondary | ICD-10-CM | POA: Diagnosis not present

## 2023-06-20 DIAGNOSIS — D229 Melanocytic nevi, unspecified: Secondary | ICD-10-CM | POA: Diagnosis not present

## 2023-06-20 DIAGNOSIS — L821 Other seborrheic keratosis: Secondary | ICD-10-CM | POA: Diagnosis not present

## 2023-06-20 DIAGNOSIS — L578 Other skin changes due to chronic exposure to nonionizing radiation: Secondary | ICD-10-CM | POA: Diagnosis not present

## 2023-06-20 DIAGNOSIS — L814 Other melanin hyperpigmentation: Secondary | ICD-10-CM | POA: Diagnosis not present

## 2023-07-03 DIAGNOSIS — N958 Other specified menopausal and perimenopausal disorders: Secondary | ICD-10-CM | POA: Diagnosis not present

## 2023-07-03 DIAGNOSIS — E2839 Other primary ovarian failure: Secondary | ICD-10-CM | POA: Diagnosis not present

## 2023-07-03 DIAGNOSIS — M8588 Other specified disorders of bone density and structure, other site: Secondary | ICD-10-CM | POA: Diagnosis not present

## 2023-07-06 DIAGNOSIS — M545 Low back pain, unspecified: Secondary | ICD-10-CM | POA: Diagnosis not present

## 2023-07-06 DIAGNOSIS — H52203 Unspecified astigmatism, bilateral: Secondary | ICD-10-CM | POA: Diagnosis not present

## 2023-07-06 DIAGNOSIS — Z961 Presence of intraocular lens: Secondary | ICD-10-CM | POA: Diagnosis not present

## 2023-07-06 DIAGNOSIS — M25552 Pain in left hip: Secondary | ICD-10-CM | POA: Diagnosis not present

## 2023-07-06 DIAGNOSIS — H40023 Open angle with borderline findings, high risk, bilateral: Secondary | ICD-10-CM | POA: Diagnosis not present

## 2023-08-01 DIAGNOSIS — M25552 Pain in left hip: Secondary | ICD-10-CM | POA: Diagnosis not present

## 2023-08-01 DIAGNOSIS — M545 Low back pain, unspecified: Secondary | ICD-10-CM | POA: Diagnosis not present

## 2023-08-16 DIAGNOSIS — M159 Polyosteoarthritis, unspecified: Secondary | ICD-10-CM | POA: Diagnosis not present

## 2023-08-16 DIAGNOSIS — M154 Erosive (osteo)arthritis: Secondary | ICD-10-CM | POA: Diagnosis not present

## 2023-08-16 DIAGNOSIS — M6740 Ganglion, unspecified site: Secondary | ICD-10-CM | POA: Diagnosis not present

## 2023-08-16 DIAGNOSIS — M8580 Other specified disorders of bone density and structure, unspecified site: Secondary | ICD-10-CM | POA: Diagnosis not present

## 2023-08-16 DIAGNOSIS — Z791 Long term (current) use of non-steroidal anti-inflammatories (NSAID): Secondary | ICD-10-CM | POA: Diagnosis not present

## 2023-08-16 DIAGNOSIS — K2970 Gastritis, unspecified, without bleeding: Secondary | ICD-10-CM | POA: Diagnosis not present

## 2023-08-16 DIAGNOSIS — M79643 Pain in unspecified hand: Secondary | ICD-10-CM | POA: Diagnosis not present

## 2023-08-16 DIAGNOSIS — M2550 Pain in unspecified joint: Secondary | ICD-10-CM | POA: Diagnosis not present

## 2023-09-12 DIAGNOSIS — M545 Low back pain, unspecified: Secondary | ICD-10-CM | POA: Diagnosis not present

## 2023-09-12 DIAGNOSIS — M25552 Pain in left hip: Secondary | ICD-10-CM | POA: Diagnosis not present

## 2023-10-10 DIAGNOSIS — M545 Low back pain, unspecified: Secondary | ICD-10-CM | POA: Diagnosis not present

## 2023-10-10 DIAGNOSIS — M25552 Pain in left hip: Secondary | ICD-10-CM | POA: Diagnosis not present

## 2023-11-02 DIAGNOSIS — H0011 Chalazion right upper eyelid: Secondary | ICD-10-CM | POA: Diagnosis not present

## 2023-11-16 DIAGNOSIS — M25552 Pain in left hip: Secondary | ICD-10-CM | POA: Diagnosis not present

## 2023-11-16 DIAGNOSIS — M545 Low back pain, unspecified: Secondary | ICD-10-CM | POA: Diagnosis not present

## 2023-12-14 DIAGNOSIS — M545 Low back pain, unspecified: Secondary | ICD-10-CM | POA: Diagnosis not present

## 2023-12-14 DIAGNOSIS — M25552 Pain in left hip: Secondary | ICD-10-CM | POA: Diagnosis not present
